# Patient Record
Sex: Male | Born: 1955 | Race: Black or African American | Hispanic: No | Smoking: Never smoker
Health system: Southern US, Community
[De-identification: ages and names within clinical notes are randomized; demographics above are authoritative.]

## PROBLEM LIST (undated history)

## (undated) DIAGNOSIS — N189 Chronic kidney disease, unspecified: Secondary | ICD-10-CM

## (undated) DIAGNOSIS — E785 Hyperlipidemia, unspecified: Secondary | ICD-10-CM

## (undated) DIAGNOSIS — Z87898 Personal history of other specified conditions: Secondary | ICD-10-CM

## (undated) DIAGNOSIS — E291 Testicular hypofunction: Secondary | ICD-10-CM

## (undated) DIAGNOSIS — I10 Essential (primary) hypertension: Secondary | ICD-10-CM

## (undated) DIAGNOSIS — N400 Enlarged prostate without lower urinary tract symptoms: Secondary | ICD-10-CM

## (undated) HISTORY — DX: Essential (primary) hypertension: I10

## (undated) HISTORY — DX: Chronic kidney disease, unspecified: N18.9

## (undated) HISTORY — PX: NASAL SINUS SURGERY: SHX719

## (undated) HISTORY — PX: MOUTH SURGERY: SHX715

## (undated) HISTORY — DX: Personal history of other specified conditions: Z87.898

## (undated) HISTORY — DX: Testicular hypofunction: E29.1

## (undated) HISTORY — DX: Hyperlipidemia, unspecified: E78.5

---

## 2005-04-21 ENCOUNTER — Ambulatory Visit: Payer: Self-pay | Admitting: Internal Medicine

## 2005-04-25 ENCOUNTER — Ambulatory Visit: Payer: Self-pay | Admitting: Internal Medicine

## 2005-04-29 ENCOUNTER — Ambulatory Visit: Payer: Self-pay

## 2006-05-24 ENCOUNTER — Ambulatory Visit: Payer: Self-pay | Admitting: Internal Medicine

## 2006-05-24 LAB — CONVERTED CEMR LAB
ALT: 35 units/L (ref 0–40)
AST: 24 units/L (ref 0–37)
Albumin: 4.3 g/dL (ref 3.5–5.2)
Alkaline Phosphatase: 60 units/L (ref 39–117)
BUN: 12 mg/dL (ref 6–23)
Basophils Relative: 0.3 % (ref 0.0–1.0)
Chol/HDL Ratio, serum: 2.9
Cholesterol: 125 mg/dL (ref 0–200)
Creatinine, Ser: 1.2 mg/dL (ref 0.4–1.5)
Eosinophil percent: 1.8 % (ref 0.0–5.0)
GFR calc non Af Amer: 68 mL/min
HCT: 43 % (ref 39.0–52.0)
Hemoglobin: 14 g/dL (ref 13.0–17.0)
Hgb A1c MFr Bld: 13.3 % — ABNORMAL HIGH (ref 4.6–6.0)
Leukocytes, UA: NEGATIVE
Neutro Abs: 2.3 10*3/uL (ref 1.4–7.7)
Neutrophils Relative %: 43.7 % (ref 43.0–77.0)
Nitrite: NEGATIVE
PSA: 1.71 ng/mL (ref 0.10–4.00)
Potassium: 4.6 meq/L (ref 3.5–5.1)
RBC: 4.83 M/uL (ref 4.22–5.81)
RDW: 12.3 % (ref 11.5–14.6)
Sodium: 141 meq/L (ref 135–145)
Specific Gravity, Urine: >=1.03 (ref 1.000–1.03)
Total Bilirubin: 0.8 mg/dL (ref 0.3–1.2)
Total Protein: 6.8 g/dL (ref 6.0–8.3)
Urine Glucose: >=1000 mg/dL — CR
WBC: 5.2 10*3/uL (ref 4.5–10.5)

## 2006-05-29 ENCOUNTER — Ambulatory Visit: Payer: Self-pay | Admitting: Internal Medicine

## 2007-05-18 ENCOUNTER — Encounter: Payer: Self-pay | Admitting: Internal Medicine

## 2007-06-06 ENCOUNTER — Ambulatory Visit: Payer: Self-pay | Admitting: Internal Medicine

## 2007-06-08 ENCOUNTER — Encounter: Payer: Self-pay | Admitting: Internal Medicine

## 2007-06-08 DIAGNOSIS — I10 Essential (primary) hypertension: Secondary | ICD-10-CM

## 2007-06-08 DIAGNOSIS — E1129 Type 2 diabetes mellitus with other diabetic kidney complication: Secondary | ICD-10-CM

## 2007-06-08 DIAGNOSIS — E785 Hyperlipidemia, unspecified: Secondary | ICD-10-CM

## 2007-06-08 HISTORY — DX: Type 2 diabetes mellitus with other diabetic kidney complication: E11.29

## 2007-06-08 HISTORY — DX: Essential (primary) hypertension: I10

## 2007-06-08 HISTORY — DX: Hyperlipidemia, unspecified: E78.5

## 2007-06-29 ENCOUNTER — Ambulatory Visit: Payer: Self-pay | Admitting: Internal Medicine

## 2007-06-29 DIAGNOSIS — E291 Testicular hypofunction: Secondary | ICD-10-CM

## 2007-06-29 HISTORY — DX: Testicular hypofunction: E29.1

## 2007-09-14 ENCOUNTER — Ambulatory Visit: Payer: Self-pay | Admitting: Internal Medicine

## 2007-09-14 LAB — CONVERTED CEMR LAB
ALT: 24 units/L (ref 0–53)
AST: 21 units/L (ref 0–37)
Albumin: 4.1 g/dL (ref 3.5–5.2)
Calcium: 9.5 mg/dL (ref 8.4–10.5)
Chloride: 102 meq/L (ref 96–112)
Cholesterol: 134 mg/dL (ref 0–200)
Creatinine, Ser: 1.1 mg/dL (ref 0.4–1.5)
GFR calc non Af Amer: 75 mL/min
Glucose, Bld: 203 mg/dL — ABNORMAL HIGH (ref 70–99)
HDL: 39.3 mg/dL (ref >39.0)
LDL Cholesterol: 77 mg/dL (ref 0–99)
Sodium: 138 meq/L (ref 135–145)
Total Bilirubin: 0.6 mg/dL (ref 0.3–1.2)

## 2008-01-29 ENCOUNTER — Encounter: Payer: Self-pay | Admitting: Internal Medicine

## 2008-05-06 ENCOUNTER — Encounter: Payer: Self-pay | Admitting: Internal Medicine

## 2008-07-03 ENCOUNTER — Ambulatory Visit: Payer: Self-pay | Admitting: Internal Medicine

## 2008-07-03 LAB — CONVERTED CEMR LAB
ALT: 30 units/L (ref 0–53)
Albumin: 4.5 g/dL (ref 3.5–5.2)
BUN: 16 mg/dL (ref 6–23)
Basophils Absolute: 0 10*3/uL (ref 0.0–0.1)
Basophils Relative: 0.2 % (ref 0.0–3.0)
CO2: 31 meq/L (ref 19–32)
Calcium: 9.5 mg/dL (ref 8.4–10.5)
Creatinine, Ser: 1 mg/dL (ref 0.4–1.5)
Eosinophils Absolute: 0.1 10*3/uL (ref 0.0–0.7)
Eosinophils Relative: 1.7 % (ref 0.0–5.0)
GFR calc non Af Amer: 83 mL/min
HCT: 40.9 % (ref 39.0–52.0)
HDL: 38.8 mg/dL — ABNORMAL LOW (ref >39.0)
Hemoglobin: 14 g/dL (ref 13.0–17.0)
Ketones, ur: NEGATIVE mg/dL
LDL Cholesterol: 79 mg/dL (ref 0–99)
MCHC: 34.3 g/dL (ref 30.0–36.0)
MCV: 89.2 fL (ref 78.0–100.0)
Neutro Abs: 2.2 10*3/uL (ref 1.4–7.7)
PSA: 2.66 ng/mL (ref 0.10–4.00)
RBC: 4.58 M/uL (ref 4.22–5.81)
Specific Gravity, Urine: >=1.03 (ref 1.000–1.03)
TSH: 3.66 microintl units/mL (ref 0.35–5.50)
Total Bilirubin: 0.9 mg/dL (ref 0.3–1.2)
Total Protein, Urine: 30 mg/dL — AB
Urine Glucose: NEGATIVE mg/dL
VLDL: 13 mg/dL (ref 0–40)
pH: 5.5 (ref 5.0–8.0)

## 2008-07-10 ENCOUNTER — Ambulatory Visit: Payer: Self-pay | Admitting: Internal Medicine

## 2008-07-11 ENCOUNTER — Telehealth: Payer: Self-pay | Admitting: Internal Medicine

## 2008-07-11 ENCOUNTER — Ambulatory Visit: Payer: Self-pay | Admitting: Internal Medicine

## 2008-07-13 ENCOUNTER — Telehealth: Payer: Self-pay | Admitting: Internal Medicine

## 2008-08-27 ENCOUNTER — Telehealth: Payer: Self-pay | Admitting: Internal Medicine

## 2008-09-16 ENCOUNTER — Ambulatory Visit: Payer: Self-pay | Admitting: Internal Medicine

## 2008-11-04 ENCOUNTER — Encounter: Payer: Self-pay | Admitting: Internal Medicine

## 2009-01-26 ENCOUNTER — Telehealth: Payer: Self-pay | Admitting: Internal Medicine

## 2009-02-02 ENCOUNTER — Telehealth: Payer: Self-pay | Admitting: Internal Medicine

## 2009-02-23 ENCOUNTER — Telehealth: Payer: Self-pay | Admitting: Internal Medicine

## 2009-06-23 ENCOUNTER — Telehealth: Payer: Self-pay | Admitting: Internal Medicine

## 2009-07-07 ENCOUNTER — Ambulatory Visit: Payer: Self-pay | Admitting: Internal Medicine

## 2009-07-07 LAB — CONVERTED CEMR LAB
ALT: 22 units/L (ref 0–53)
AST: 19 units/L (ref 0–37)
Alkaline Phosphatase: 66 units/L (ref 39–117)
BUN: 16 mg/dL (ref 6–23)
Basophils Absolute: 0 10*3/uL (ref 0.0–0.1)
Bilirubin Urine: NEGATIVE
Bilirubin, Direct: 0.2 mg/dL (ref 0.0–0.3)
Calcium: 8.9 mg/dL (ref 8.4–10.5)
Eosinophils Relative: 1.6 % (ref 0.0–5.0)
GFR calc non Af Amer: 99.98 mL/min (ref >60)
Glucose, Bld: 174 mg/dL — ABNORMAL HIGH (ref 70–99)
HCT: 41.1 % (ref 39.0–52.0)
HDL: 39.1 mg/dL (ref >39.00)
Hemoglobin, Urine: NEGATIVE
Ketones, ur: NEGATIVE mg/dL
LDL Cholesterol: 63 mg/dL (ref 0–99)
Leukocytes, UA: NEGATIVE
Lymphocytes Relative: 41.4 % (ref 12.0–46.0)
Monocytes Relative: 9.7 % (ref 3.0–12.0)
Neutrophils Relative %: 46.5 % (ref 43.0–77.0)
Nitrite: NEGATIVE
PSA: 5.23 ng/mL — ABNORMAL HIGH (ref 0.10–4.00)
Platelets: 153 10*3/uL (ref 150.0–400.0)
Potassium: 4 meq/L (ref 3.5–5.1)
RDW: 12.6 % (ref 11.5–14.6)
TSH: 1.3 microintl units/mL (ref 0.35–5.50)
Total Bilirubin: 1.1 mg/dL (ref 0.3–1.2)
VLDL: 11.8 mg/dL (ref 0.0–40.0)
WBC: 4.6 10*3/uL (ref 4.5–10.5)
pH: 5 (ref 5.0–8.0)

## 2009-07-16 ENCOUNTER — Ambulatory Visit: Payer: Self-pay | Admitting: Internal Medicine

## 2009-07-16 DIAGNOSIS — R972 Elevated prostate specific antigen [PSA]: Secondary | ICD-10-CM

## 2009-07-16 HISTORY — DX: Elevated prostate specific antigen (PSA): R97.20

## 2009-07-31 ENCOUNTER — Encounter: Payer: Self-pay | Admitting: Internal Medicine

## 2009-09-04 ENCOUNTER — Encounter: Payer: Self-pay | Admitting: Internal Medicine

## 2010-02-25 ENCOUNTER — Encounter: Payer: Self-pay | Admitting: Internal Medicine

## 2010-06-27 HISTORY — PX: MOUTH SURGERY: SHX715

## 2010-07-27 NOTE — Assessment & Plan Note (Signed)
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 exam was normal on 03/16/2009 with advisement of 1 year follow up  Vision Entered By: Ami Bullins CMA (July 16, 2009 10:09 AM)   Primary Care Provider:  Kawanna Christley  CC:  CPX/ pt has never had a colonoscopy/ ab.  History of Present Illness: Patinet presents for follow-up of active medical problems of hypogonadism, diabetes and hypertension. He has been feeeling well. He does report pain in his right heel with initial weight bearing.  No injuries or surgeries in the interval. Last visit to Dr. Retta Diones was in may '10.   Current Medications (verified): 1)  Lisinopril 10 Mg Tabs (Lisinopril) .... Take 1 Tablet By Mouth Once A Day 2)  Zocor 20 Mg  Tabs (Simvastatin) .... Take 1 Tablet By Mouth Once A Day 3)  Amitriptyline Hcl 25 Mg  Tabs (Amitriptyline Hcl) .... Take 1 Tablet By Mouth Once A Day 4)  Metformin Hcl 1000 Mg Tabs (Metformin Hcl) .Marland Kitchen.. 1 By Mouth Two Times A Day. 5)  Androgel Pump 1 %  Gel (Testosterone) .... 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 mother - '32; arthritis brothers- two deceased-alcoholism; cancer; one brother HTN sister-good health Neg- prostate, colon cancer; DM, CAD  Social History: Last updated: 07/10/2008 [employer], [employer] -2 years Married '83-seperated '06 1 son '81; 1 daughter ''5; 1 grandchild (girl) '06 work: load Designer, television/film set at [employer], work is slowing down.  Risk Factors: Caffeine Use: 2 (07/08/2007) Exercise: no (07/10/2008)  Risk Factors: Smoking Status: quit (06/08/2007)  Review of Systems  The patient denies anorexia, fever, weight loss, weight gain, vision loss, decreased hearing, hoarseness, chest pain, syncope, dyspnea on exertion, peripheral edema, prolonged cough, headaches, hemoptysis, abdominal pain, melena, hematochezia, severe indigestion/heartburn, hematuria, incontinence, genital sores, muscle weakness, suspicious skin lesions, difficulty walking, depression, unusual weight change, abnormal bleeding, enlarged lymph nodes, and testicular masses.    Physical Exam  General:  Well-developed,well-nourished,in no acute distress; alert,appropriate and cooperative throughout examination Head:  Normocephalic and atraumatic without obvious abnormalities. No apparent alopecia or balding. Eyes:  vision grossly intact, pupils equal, pupils round, corneas and lenses clear, and no injection.   Ears:  External ear exam shows no significant lesions or deformities.  Otoscopic examination reveals clear canals, tympanic membranes are intact bilaterally without bulging, retraction, inflammation or discharge. Hearing is grossly normal bilaterally. Nose:  no external deformity and no external erythema.  Mouth:  torus palitini, no oral lesions, teeth in good repair, throat clear Neck:  supple, full ROM, no thyromegaly, and no carotid bruits.   Chest Wall:  no deformities.   Lungs:  Normal respiratory effort, chest expands symmetrically. Lungs are clear to auscultation, no crackles or  wheezes. Heart:  Normal rate and regular rhythm. S1 and S2 normal without gallop, murmur, click, rub or other extra sounds. Abdomen:  soft, non-tender, normal bowel sounds, no masses, no guarding, and no hepatomegaly.   Prostate:  deferred to [facility] Msk:  normal ROM, no joint tenderness, no joint swelling, no joint warmth, no redness over joints, no joint deformities, and no joint instability.  Point tenderness plantar aspect right heel Pulses:  2+ radial and DP pusles Extremities:  No clubbing, cyanosis, edema, or deformity noted with normal full range of motion of all joints.   Neurologic:  alert & oriented X3, cranial nerves II-XII intact, strength normal in all extremities, sensation intact to light touch, sensation intact to pinprick, gait normal, and DTRs symmetrical and normal.   Skin:  turgor normal, color normal, no rashes, no suspicious lesions, and no ulcerations.   Cervical Nodes:  no anterior cervical adenopathy and no posterior cervical adenopathy.   Psych:  Oriented X3, memory intact for recent and remote, normally interactive, good eye contact, and not anxious appearing.    Diabetes Management Exam:    Foot Exam (with socks and/or shoes not present):       Sensory-Pinprick/Light touch:          Right medial foot (L-4): normal          Right dorsal foot (L-5): normal          Right lateral foot (S-1): normal       Sensory-other: normal deep vibratory sensation    Eye Exam:       Eye Exam done elsewhere          Date: 10/08/2008          Results: normal          Done by: Dr. 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 Retta Diones. Concern about continued hormone use in a setting of rising PSA.  Problem # 2:  HYPERTENSION (ICD-401.9)  His updated medication list for this problem includes:    Lisinopril 20 Mg Tabs (Lisinopril) .Marland Kitchen... 1 by mouth once daily  BP today: 142/96 Prior BP: 150/90 (09/16/2008)  Labs  Reviewed: K+: 4.0 (07/07/2009) Creat: : 1.0 (07/07/2009)    Suboptimal control - goal is 120/80  Plan - increase Lisinopril to 20mg  daily. Close BP follow-up.  Problem # 3:  HYPERLIPIDEMIA (ICD-272.4)  His updated medication list for this problem includes:    Zocor 20 Mg Tabs (Simvastatin) .Marland Kitchen... Take 1 tablet by mouth once a day  Labs Reviewed: SGOT: 19 (07/07/2009)   SGPT: 22 (07/07/2009)   HDL:39.10 (07/07/2009), 38.8 (07/03/2008)  LDL:63 (07/07/2009), 79 (07/03/2008)  Chol:114 (07/07/2009), 131 (07/03/2008)  Trig:59.0 (07/07/2009), 67 (07/03/2008)  Good control on present medication. Continue the same  Problem # 4:  DIABETES MELLITUS, TYPE II (ICD-250.00)  His updated medication list for this problem includes:    Lisinopril 20 Mg Tabs (Lisinopril) .Marland Kitchen... 1 by mouth once daily    Metformin Hcl 1000 Mg Tabs (Metformin hcl) .Marland Kitchen... 1 by mouth two times a day.    Actos 30 Mg Tabs (Pioglitazone hcl) .Marland Kitchen... 1 by mouth once daily  Orders:  TLB-A1C / Hgb A1C (Glycohemoglobin) (83036-A1C)  Labs Reviewed: Creat: 1.0 (07/07/2009)     Last Eye Exam: normal (10/08/2008) Reviewed HgBA1c results: 9.3 (07/11/2008)  9.7 (09/14/2007)  Tests: (1) Hemoglobin A1C (A1C)   Hemoglobin A1C       [H]  8.9 %    Plan - add actos 30mg  by mouth once daily   Problem # 5:  ELEVATED PROSTATE SPECIFIC ANTIGEN (ICD-790.93) PSA has gone up significantly from Last year: 2.66 to 5.23 over 12 months.  Plan - patient has an appointment with Dr. Retta Diones for Feb - will defer further work up to him.  Problem # 6:  Preventive Health Care (ICD-V70.0) Normla history free of symptoms. Exam, deferring prostate exam, is normal. Labs notable for elevated PSA. Current with tetnus. Had flu shot last year. Is a cnadidate for colonoscopy for colorectal cancer screening.  In summary - a very nice man who is not controlled re: diabetes and has a rising PSA that will need further evaluation. He needs to return in 4-6 weeks  for follow-up: BP check, new med check.   Complete Medication List: 1)  Lisinopril 20 Mg Tabs (Lisinopril) .Marland Kitchen.. 1 by mouth once daily 2)  Zocor 20 Mg Tabs (Simvastatin) .... Take 1 tablet by mouth once a day 3)  Amitriptyline Hcl 25 Mg Tabs (Amitriptyline hcl) .... Take 1 tablet by mouth once a day 4)  Metformin Hcl 1000 Mg Tabs (Metformin hcl) .Marland Kitchen.. 1 by mouth two times a day. 5)  Androgel Pump 1 % Gel (Testosterone) .... Apply daily 6)  Actos 30 Mg Tabs (Pioglitazone hcl) .Marland Kitchen.. 1 by mouth once daily   Patient: Joe Wise Note: All result statuses are Final unless otherwise noted.  Tests: (1) BMP (METABOL)   Sodium                    139 mEq/L                   135-145   Potassium                 4.0 mEq/L                   3.5-5.1   Chloride                  102 mEq/L                   96-112   Carbon Dioxide            30 mEq/L                    19-32   Glucose              [H]  174 mg/dL                   81-19   BUN                       16 mg/dL                    1-47   Creatinine                1.0 mg/dL                   8.2-9.5   Calcium  8.9 mg/dL                   5.7-84.6   GFR                       99.98 mL/min                >60  Tests: (2) CBC Platelet w/Diff (CBCD)   White Cell Count          4.6 K/uL                    4.5-10.5   Red Cell Count            4.49 Mil/uL                 4.22-5.81   Hemoglobin                13.2 g/dL                   96.2-95.2   Hematocrit                41.1 %                      39.0-52.0   MCV                       91.6 fl                     78.0-100.0   MCHC                      32.2 g/dL                   84.1-32.4   RDW                       12.6 %                      11.5-14.6   Platelet Count            153.0 K/uL                  150.0-400.0   Neutrophil %              46.5 %                      43.0-77.0   Lymphocyte %              41.4 %                      12.0-46.0   Monocyte %                 9.7 %                       3.0-12.0   Eosinophils%              1.6 %                       0.0-5.0   Basophils %               0.8 %  0.0-3.0   Neutrophill Absolute      2.2 K/uL                    1.4-7.7   Lymphocyte Absolute       1.9 K/uL                    0.7-4.0   Monocyte Absolute         0.4 K/uL                    0.1-1.0  Eosinophils, Absolute                             0.1 K/uL                    0.0-0.7   Basophils Absolute        0.0 K/uL                    0.0-0.1  Tests: (3) Hepatic/Liver Function Panel (HEPATIC)   Total Bilirubin           1.1 mg/dL                   1.6-1.0   Direct Bilirubin          0.2 mg/dL                   9.6-0.4   Alkaline Phosphatase      66 U/L                      39-117   AST                       19 U/L                      0-37   ALT                       22 U/L                      0-53   Total Protein             6.7 g/dL                    5.4-0.9   Albumin                   4.3 g/dL                    8.1-1.9  Tests: (4) TSH (TSH)   FastTSH                   1.30 uIU/mL                 0.35-5.50  Tests: (5) Lipid Panel (LIPID)   Cholesterol               114 mg/dL                   1-478     ATP III Classification            Desirable:  < 200 mg/dL  Borderline High:  200 - 239 mg/dL               High:  > = 240 mg/dL   Triglycerides             59.0 mg/dL                  2.5-956.3     Normal:  <150 mg/dL     Borderline High:  875 - 199 mg/dL   HDL                       64.33 mg/dL                 >29.51   VLDL Cholesterol          11.8 mg/dL                  8.8-41.6   LDL Cholesterol           63 mg/dL                    6-06  CHO/HDL Ratio:  CHD Risk                             3                    Men          Women     1/2 Average Risk     3.4          3.3     Average Risk          5.0          4.4     2X Average Risk          9.6          7.1     3X Average Risk          15.0           11.0                           Tests: (6) Prostate Specific Antigen (PSA)   PSA-Hyb              [H]  5.23 ng/mL                  0.10-4.00  Tests: (7) UDip Only (UDIP)   Color                     YELLOW       RANGE:  Yellow;Lt. Yellow   Clarity                   CLEAR                       Clear   Specific Gravity          >=1.030                     1.000 - 1.030   Urine Ph                  5.0  5.0-8.0   Protein                   TRACE                       Negative   Urine Glucose             100                         Negative   Ketones                   NEGATIVE                    Negative   Urine Bilirubin           NEGATIVE                    Negative   Blood                     NEGATIVE                    Negative   Urobilinogen              0.2                         0.0 - 1.0   Leukocyte Esterace        NEGATIVE                    Negative   Nitrite                   NEGATIVE                    NegativePrescriptions: ACTOS 30 MG TABS (PIOGLITAZONE HCL) 1 by mouth once daily  #30 x 12   Entered and Authorized by:   Jacques Navy MD   Signed by:   Jacques Navy MD on 07/17/2009   Method used:   Electronically to        [facility] [phone]* (retail)       [address]       [place], [place]  [address]       Ph: [phone] or [phone]       Fax: [phone]   RxID:   [id number] METFORMIN HCL 1000 MG TABS (METFORMIN HCL) 1 by mouth two times a day.  #60 x 12   Entered and Authorized by:   Jacques Navy MD   Signed by:   Jacques Navy MD on 07/16/2009   Method used:   Electronically to        [facility] [phone]* (retail)       [address]       [place], [place]  [address]       Ph: [phone] or [phone]       Fax: [phone]   RxID:   [id number] AMITRIPTYLINE HCL 25 MG  TABS (AMITRIPTYLINE HCL) Take 1 tablet by mouth once a day  #30 Tablet x 12   Entered and Authorized by:   Jacques Navy MD   Signed by:    Jacques Navy MD on 07/16/2009   Method used:   [facility]        [facility] [id number]       [address]       [place], [place]  [address]       Ph: [phone] or [phone]       Fax: [phone]   RxID:   [id number] ZOCOR 20 MG  TABS (SIMVASTATIN) Take 1 tablet by mouth once a day  #30 Tablet x 12   Entered and Authorized by:   Jacques Navy MD   Signed by:   Jacques Navy MD on 07/16/2009   Method used:   Electronically to        [facility] [phone]* (retail)       [address]       [place], [place]  [address]       Ph: [phone] or [phone]       Fax: [phone]   RxID:   [id number] LISINOPRIL 20 MG TABS (LISINOPRIL) 1 by mouth once daily  #30 x 12   Entered and Authorized by:   Jacques Navy MD   Signed by:   Jacques Navy MD on 07/16/2009   Method used:   Electronically to        [facility] [phone][facility] (retail)       [address]       [place], [place]  [address]       Ph: [phone] or [phone]       Fax: [phone]   RxID:   [id number]    Immunization History:  Influenza Immunization History:    Influenza:  historical (03/31/2009)

## 2010-07-27 NOTE — Letter (Signed)
Summary: [facility]   Imported By: Lennie Odor 08/05/2009 11:19:29  _____________________________________________________________________  External Attachment:    Type:   Image     Comment:   External Document

## 2010-07-27 NOTE — Letter (Signed)
Summary: [facility]  [facility]   Imported By: Lester Eyota 03/04/2010 09:25:50  _____________________________________________________________________  External Attachment:    Type:   Image     Comment:   External Document

## 2010-07-30 NOTE — Op Note (Signed)
Summary: [facility]   Imported By: Sherian Rein 09/14/2009 10:27:50  _____________________________________________________________________  External Attachment:    Type:   Image     Comment:   External Document

## 2010-08-11 LAB — HM DIABETES EYE EXAM: HM Diabetic Eye Exam: NORMAL

## 2010-09-03 ENCOUNTER — Other Ambulatory Visit: Payer: Self-pay

## 2010-09-03 ENCOUNTER — Other Ambulatory Visit: Payer: Self-pay | Admitting: Internal Medicine

## 2010-09-03 ENCOUNTER — Encounter (INDEPENDENT_AMBULATORY_CARE_PROVIDER_SITE_OTHER): Payer: Self-pay | Admitting: *Deleted

## 2010-09-03 DIAGNOSIS — Z0000 Encounter for general adult medical examination without abnormal findings: Secondary | ICD-10-CM

## 2010-09-03 LAB — URINALYSIS
Bilirubin Urine: NEGATIVE
Hgb urine dipstick: NEGATIVE
Ketones, ur: NEGATIVE
Leukocytes, UA: NEGATIVE
Urobilinogen, UA: 0.2 (ref 0.0–1.0)

## 2010-09-03 LAB — CBC WITH DIFFERENTIAL/PLATELET
Eosinophils Relative: 1.8 % (ref 0.0–5.0)
HCT: 37.5 % — ABNORMAL LOW (ref 39.0–52.0)
Hemoglobin: 12.7 g/dL — ABNORMAL LOW (ref 13.0–17.0)
Lymphs Abs: 2.1 10*3/uL (ref 0.7–4.0)
MCV: 87.8 fl (ref 78.0–100.0)
Monocytes Absolute: 0.4 10*3/uL (ref 0.1–1.0)
Monocytes Relative: 8.4 % (ref 3.0–12.0)
Neutro Abs: 2.5 10*3/uL (ref 1.4–7.7)
RDW: 14 % (ref 11.5–14.6)
WBC: 5.2 10*3/uL (ref 4.5–10.5)

## 2010-09-03 LAB — HEPATIC FUNCTION PANEL
ALT: 19 U/L (ref 0–53)
AST: 18 U/L (ref 0–37)
Albumin: 4.3 g/dL (ref 3.5–5.2)

## 2010-09-03 LAB — LIPID PANEL
Cholesterol: 121 mg/dL (ref 0–200)
VLDL: 21.2 mg/dL (ref 0.0–40.0)

## 2010-09-03 LAB — BASIC METABOLIC PANEL
BUN: 16 mg/dL (ref 6–23)
Chloride: 104 mEq/L (ref 96–112)
GFR: 79.89 mL/min (ref >60.00)
Glucose, Bld: 137 mg/dL — ABNORMAL HIGH (ref 70–99)
Potassium: 4.6 mEq/L (ref 3.5–5.1)
Sodium: 142 mEq/L (ref 135–145)

## 2010-09-03 LAB — TSH: TSH: 1.41 u[IU]/mL (ref 0.35–5.50)

## 2010-09-08 ENCOUNTER — Encounter: Payer: Self-pay | Admitting: Internal Medicine

## 2010-09-08 ENCOUNTER — Other Ambulatory Visit: Payer: BC Managed Care – PPO

## 2010-09-08 ENCOUNTER — Encounter (INDEPENDENT_AMBULATORY_CARE_PROVIDER_SITE_OTHER): Payer: BC Managed Care – PPO | Admitting: Internal Medicine

## 2010-09-08 ENCOUNTER — Other Ambulatory Visit: Payer: Self-pay | Admitting: Internal Medicine

## 2010-09-08 DIAGNOSIS — E119 Type 2 diabetes mellitus without complications: Secondary | ICD-10-CM

## 2010-09-08 DIAGNOSIS — Z136 Encounter for screening for cardiovascular disorders: Secondary | ICD-10-CM

## 2010-09-08 DIAGNOSIS — Z0000 Encounter for general adult medical examination without abnormal findings: Secondary | ICD-10-CM

## 2010-09-14 NOTE — Assessment & Plan Note (Signed)
Summary: CPX/# /CD   Vital Signs:  Patient profile:   55 year old male Height:      72 inches Weight:      183 pounds BMI:     24.91 O2 Sat:      98 % on Room air Temp:     98.3 degrees F oral Pulse rate:   79 / minute BP sitting:   110 / 78  (left arm) Cuff size:   regular  Vitals Entered By: Bill Salinas CMA (September 08, 2010 10:01 AM)  O2 Flow:  Room air CC: cpx/ ab  Vision Screening:      Vision Comments: Last eye exam Jan 2012 with normal result   Primary Care Provider:  Jimia Gentles  CC:  cpx/ ab.  History of Present Illness: Mr. Alldredge pesents for routine medical folow-up. In the interval since is last visit he has had some major dental work with extraction of several teeth and removal of a bony mass from the roof of his mouth that was benign.  He had an episode of severe dysequilibrium which  by description was transient labyrinthitis. He also has noticed positional vertigo which will last 10-15 seconds with position change. Temporally these two events were soon after his oral surgery.  Otherwise he has been doing well with no medical ilnesses or injury. He is 100% indepnendent in all ADLs., he is cognitively intact and has no evidence of depression.  Current Medications (verified): 1)  Lisinopril 20 Mg Tabs (Lisinopril) .Marland Kitchen.. 1 By Mouth Once Daily 2)  Zocor 20 Mg  Tabs (Simvastatin) .... Take 1 Tablet By Mouth Once A Day 3)  Amitriptyline Hcl 25 Mg  Tabs (Amitriptyline Hcl) .... Take 1 Tablet By Mouth Once A Day 4)  Metformin Hcl 1000 Mg Tabs (Metformin Hcl) .Marland Kitchen.. 1 By Mouth Two Times A Day. 5)  Androgel Pump 1 %  Gel (Testosterone) .... Apply Daily 6)  Actos 30 Mg Tabs (Pioglitazone Hcl) .Marland Kitchen.. 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 father- died 33; history not known. mother - '32; arthritis brothers- two deceased-alcoholism; cancer; one brother HTN sister-good health Neg- prostate, colon cancer; DM, CAD  Social History: Reviewed history from 07/10/2008 and no changes required. HSG, [employer] -2 years Married '83-seperated '06 1 son '81; 1 daughter ''6; 1 grandchild (girl) '06 work: load Designer, television/film set at [employer], work is slowing down.  Review of Systems  The patient denies anorexia, fever, weight loss, weight gain, vision loss, decreased hearing, chest pain, syncope, dyspnea on exertion, prolonged cough, abdominal pain, severe indigestion/heartburn, hematuria, muscle weakness, suspicious skin lesions, difficulty walking, depression, abnormal bleeding, and enlarged lymph nodes.    Physical Exam  General:  alert, well-developed, well-nourished, well-hydrated, and normal appearing AA male.   Head:  normocephalic, atraumatic, and no abnormalities observed.   Eyes:  vision grossly intact, pupils equal, pupils round, and pupils reactive to light.   Ears:  External ear exam shows no significant lesions or deformities.  Otoscopic examination reveals clear canals, tympanic membranes are intact bilaterally without bulging, retraction, inflammation or discharge. Hearing is grossly normal  bilaterally. Nose:  no external deformity and no external erythema.   Mouth:  Missing front teeth, well healed roof of the mouth. Posterior pharynx clear Neck:  supple and full ROM.   Chest Wall:  no deformities.   Lungs:  Normal respiratory effort, chest expands symmetrically. Lungs are clear to auscultation, no crackles or wheezes. Heart:  Normal rate and regular rhythm. S1 and S2 normal without gallop,  murmur, click, rub or other extra sounds. Abdomen:  soft, non-tender, normal bowel sounds, no guarding, no rigidity, and no hepatomegaly.   Prostate:  deferred to GU Msk:  normal ROM, no joint tenderness, no joint swelling, no joint warmth, and no joint deformities.   Pulses:  2+ radial and DP pulses Extremities:  No clubbing, cyanosis, edema, or deformity noted with normal full range of motion of all joints.   Neurologic:  alert & oriented X3, cranial nerves II-XII intact, strength normal in all extremities, and gait normal.   Skin:  turgor normal, color normal, no rashes, and no suspicious lesions.   Cervical Nodes:  no anterior cervical adenopathy and no posterior cervical adenopathy.   Psych:  Oriented X3, memory intact for recent and remote, normally interactive, and good eye contact.    Diabetes Management Exam:    Foot Exam (with socks and/or shoes not present):       Sensory-Pinprick/Light touch:          Right medial foot (L-4): normal          Right dorsal foot (L-5): normal          Right lateral foot (S-1): normal       Sensory-Monofilament:          Right foot: normal       Inspection:          Right foot: normal       Nails:          Right foot: normal    Eye Exam:       Eye Exam done elsewhere          Date: 08/11/2010          Results: normal          Done by: optomitrist   Impression & Recommendations:  Problem # 1:  ELEVATED PROSTATE SPECIFIC ANTIGEN (ICD-790.93) PSA     1/10       1/11     3 /12              2.6          5.23     6.12  He has seen Dr. Retta Diones and had negative prostate u/s and biopsy  Plan - keep up coming appointment with Dr. 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 1 by mouth once daily  BP today: 110/78 Prior BP:  142/96 (07/16/2009)  Labs Reviewed: K+: 4.6 (09/03/2010) Creat: : 1.2 (09/03/2010)    Good control on present medical regimen - no changes proposed  Problem # 4:  HYPERLIPIDEMIA (ICD-272.4)  His updated medication list for this problem includes:    Zocor 20 Mg Tabs (Simvastatin) .Marland Kitchen... Take 1 tablet by mouth once a day  Labs Reviewed: SGOT: 18 (09/03/2010)   SGPT: 19 (09/03/2010)   HDL:42.00 (09/03/2010), 39.10 (07/07/2009)  LDL:58 (09/03/2010), 63 (  07/07/2009)  Chol:121 (09/03/2010), 114 (07/07/2009)  Trig:106.0 (09/03/2010), 59.0 (07/07/2009)   Excellent control on present medication which he tolerates well. Will continue present regimen  Problem # 5:  DIABETES MELLITUS, TYPE II (ICD-250.00) Due for A1C with recommendations to follow  His updated medication list for this problem includes:    Lisinopril 20 Mg Tabs (Lisinopril) .Marland Kitchen... 1 by mouth once daily    Metformin Hcl 1000 Mg Tabs (Metformin hcl) .Marland Kitchen... 1 by mouth two times a day.    Actos 30 Mg Tabs (Pioglitazone hcl) .Marland Kitchen... 1 by mouth once daily  Orders: TLB-A1C / Hgb A1C (Glycohemoglobin) (83036-A1C)  Addednum - A1C just above goal at 7.5% . No change in medication but recommend better dietary management. Repeat A1C in 6 months.  Problem # 6:  ROUTINE GENERAL MEDICAL EXAM@HEALTH  CARE FACL (ICD-V70.0) Interval history with dental work as noted. 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 Labs are normal as noted. Prostate cancer screening for elevated PSA has been done and he will continue to follow with Dr. Retta Diones. He is a candidate for colonoscopy for routine colorectal cancer screening. Immunizations: Tetnus Jan '09. 12 lead EKG is without evidence of ischemia or injury.  In summary- a very nice man who appears to be medically stable. He will return for A1C in 6 months otherwise he will return in 1 year or as needed.   Complete  Medication List: 1)  Lisinopril 20 Mg Tabs (Lisinopril) .Marland Kitchen.. 1 by mouth once daily 2)  Zocor 20 Mg Tabs (Simvastatin) .... Take 1 tablet by mouth once a day 3)  Amitriptyline Hcl 25 Mg Tabs (Amitriptyline hcl) .... Take 1 tablet by mouth once a day 4)  Metformin Hcl 1000 Mg Tabs (Metformin hcl) .Marland Kitchen.. 1 by mouth two times a day. 5)  Androgel Pump 1 % Gel (Testosterone) .... Apply daily 6)  Actos 30 Mg Tabs (Pioglitazone hcl) .Marland Kitchen.. 1 by mouth once daily  Other Orders: EKG w/ Interpretation (93000)  Patient: Joe Wise Note: All result statuses are Final unless otherwise noted.  Tests: (1) Lipid Panel (LIPID)   Cholesterol               121 mg/dL                   8-295     ATP III Classification            Desirable:  < 200 mg/dL                    Borderline High:  200 - 239 mg/dL               High:  > = 240 mg/dL   Triglycerides             106.0 mg/dL                 6.2-130.8     Normal:  <150 mg/dL     Borderline High:  657 - 199 mg/dL   HDL                       84.69 mg/dL                 >62.95   VLDL Cholesterol          21.2 mg/dL  0.0-40.0   LDL Cholesterol           58 mg/dL                    1-61  CHO/HDL Ratio:  CHD Risk                             3                    Men          Women     1/2 Average Risk     3.4          3.3     Average Risk          5.0          4.4     2X Average Risk          9.6          7.1     3X Average Risk          15.0          11.0                           Tests: (2) BMP (METABOL)   Sodium                    142 mEq/L                   135-145   Potassium                 4.6 mEq/L                   3.5-5.1   Chloride                  104 mEq/L                   96-112   Carbon Dioxide            32 mEq/L                    19-32   Glucose              [H]  137 mg/dL                   09-60   BUN                       16 mg/dL                    4-54   Creatinine                1.2 mg/dL                    0.9-8.1   Calcium                   9.5 mg/dL                   1.9-14.7   GFR                       79.89 mL/min                >  60.00  Tests: (3) CBC Platelet w/Diff (CBCD)   White Cell Count          5.2 K/uL                    4.5-10.5   Red Cell Count            4.28 Mil/uL                 4.22-5.81   Hemoglobin           [L]  12.7 g/dL                   19.1-47.8   Hematocrit           [L]  37.5 %                      39.0-52.0   MCV                       87.8 fl                     78.0-100.0   MCHC                      33.8 g/dL                   29.5-62.1   RDW                       14.0 %                      11.5-14.6   Platelet Count            189.0 K/uL                  150.0-400.0   Neutrophil %              48.7 %                      43.0-77.0   Lymphocyte %              40.4 %                      12.0-46.0   Monocyte %                8.4 %                       3.0-12.0   Eosinophils%              1.8 %                       0.0-5.0   Basophils %               0.7 %                       0.0-3.0   Neutrophill Absolute      2.5 K/uL                    1.4-7.7   Lymphocyte Absolute       2.1 K/uL  0.7-4.0   Monocyte Absolute         0.4 K/uL                    0.1-1.0  Eosinophils, Absolute                             0.1 K/uL                    0.0-0.7   Basophils Absolute        0.0 K/uL                    0.0-0.1  Tests: (4) Hepatic/Liver Function Panel (HEPATIC)   Total Bilirubin           0.6 mg/dL                   1.6-1.0   Direct Bilirubin          0.1 mg/dL                   9.6-0.4   Alkaline Phosphatase      55 U/L                      39-117   AST                       18 U/L                      0-37   ALT                       19 U/L                      0-53   Total Protein             6.4 g/dL                    5.4-0.9   Albumin                   4.3 g/dL                    8.1-1.9  Tests: (5) TSH (TSH)   FastTSH                    1.41 uIU/mL                 0.35-5.50  Tests: (6) UDip Only (UDIP)   Color                     Yellow       RANGE:  Yellow;Lt. Yellow   Clarity                   Sl Cloudy                   Clear   Specific Gravity          >=1.030                     1.000 - 1.030   Urine Ph                  6.0  5.0-8.0   Protein                   NEGATIVE                    Negative   Urine Glucose             NEGATIVE                    Negative   Ketones                   NEGATIVE                    Negative   Urine Bilirubin           NEGATIVE                    Negative   Blood                     NEGATIVE                    Negative   Urobilinogen              0.2                         0.0 - 1.0   Leukocyte Esterace        NEGATIVE                    Negative   Nitrite                   NEGATIVE                    Negative Tests: (1) Hemoglobin A1C (A1C)   Hemoglobin A1C       [H]  7.5 %                       4.6-6.5     Glycemic Control Guidelines for People with Diabetes:     Non Diabetic:  <6%     Goal of Therapy: <7%     Additional Action Suggested:  >8% Prescriptions: ACTOS 30 MG TABS (PIOGLITAZONE HCL) 1 by mouth once daily  #30 x 12   Entered and Authorized by:   Jacques Navy MD   Signed by:   Jacques Navy MD on 09/08/2010   Method used:   Electronically to        [facility] [phone]* (retail)       [address]       [place], [place]  [address]       Ph: [phone] or [phone]       Fax: [phone]   RxID:   [id number] METFORMIN HCL 1000 MG TABS (METFORMIN HCL) 1 by mouth two times a day.  #60.0 Tablet x 12   Entered and Authorized by:   Jacques Navy MD   Signed by:   Jacques Navy MD on 09/08/2010   Method used:   Electronically to        [facility] [phone]* (retail)       [address]       [place], [place]  [address]       Ph: [phone] or [phone]  Fax: [phone]   RxID:   [id number] AMITRIPTYLINE HCL 25  MG  TABS (AMITRIPTYLINE HCL) Take 1 tablet by mouth once a day  #30.0 Tablet x 12   Entered and Authorized by:   Jacques Navy MD   Signed by:   Jacques Navy MD on 09/08/2010   Method used:   Electronically to        [facility] [phone]* (retail)       [address]       [place], [place]  [address]       Ph: [phone] or [phone]       Fax: [phone]   RxID:   [id number] ZOCOR 20 MG  TABS (SIMVASTATIN) Take 1 tablet by mouth once a day  #30 Tablet x 12   Entered and Authorized by:   Jacques Navy MD   Signed by:   Jacques Navy MD on 09/08/2010   Method used:   Electronically to        [facility] [phone]* (retail)       [address]       [place], [place]  [address]       Ph: [phone] or [phone]       Fax: [phone]   RxID:   [id number] LISINOPRIL 20 MG TABS (LISINOPRIL) 1 by mouth once daily  #30.0 Tablet x 12   Entered and Authorized by:   Jacques Navy MD   Signed by:   Jacques Navy MD on 09/08/2010   Method used:   Electronically to        [facility] [phone]* (retail)       [address]       [place], [place]  [address]       Ph: [phone] or [phone]       Fax: [phone]   RxID:   [id number]    Orders Added: 1)  Est. Patient 40-64 years [99396] 2)  EKG w/ Interpretation [93000] 3)  TLB-A1C / Hgb A1C (Glycohemoglobin) [40814-G8J]

## 2010-11-12 NOTE — Assessment & Plan Note (Signed)
[facility]                           PRIMARY CARE OFFICE NOTE   ASON, HESLIN                     MRN:          [id number]  DATE:05/29/2006                            DOB:          03/30/1956    HISTORY OF PRESENT ILLNESS:  Joe Wise is a very pleasant 55 year old  African American gentleman who presents for follow up evaluation and  exam.  He was last seen in the office April 25, 2005, please see that  dictation.   INTERVAL HISTORY:  The patient has been doing well.  He has been working  hard on controlling his diet as far as his potassium, sugars and  carbohydrates.  He does report he has some polyuria and polydipsia.   PAST SURGICAL HISTORY:  The patient had nasal surgery in 1988.  No other  surgeries.   PAST MEDICAL HISTORY:  1. The patient had usual childhood diseases.  2. Non-insulin-dependent diabetes, diet managed.  3. Hyperlipidemia.  4. Hypertension.   CURRENT MEDICATIONS:  1. Elavil 25 mg q.h.s.  2. Lisinopril 10 mg daily.  3. Zocor 20 mg daily.   SOCIAL HISTORY:  The patient was married 24 years but has been separated  almost two years now.  He has a son who is 12, a daughter who is 67.  The patient continues to have a big garden.   REVIEW OF SYSTEMS:  Negative for Constitutional, Cardiovascular,  Respiratory, GU, GI problems.   PHYSICAL EXAMINATION:  With the help of Belia Heman, FNP student.  VITAL SIGNS:  Temperature 98.9, blood pressure 132/87, pulse 81, weight  188, height 6 feet 1 inches.  GENERAL:  Well-nourished, well-developed [place] American male in no  acute distress.  HEENT:  Normocephalic, atraumatic.  TAC's/TM's unremarkable.  Oropharynx  with __________.  Dentitions in good repair.  No buccal lesions noted.  Posterior pharynx was clear.  Conjunctivae and sclerae were clear.  PERRLA.  EOMI.  Funduscopic exam was unremarkable.  NECK:  Supple without thyromegaly.  No adenopathy was noted in  the  cervical or supraclavicular regions.  CHEST:  No CVA tenderness.  LUNGS:  Clear to auscultation and percussion.  CARDIOVASCULAR:  Radial pulses 2+.  No JVD or carotid bruits.  He had a  quite precordium with regular rate and rhythm without murmurs, rubs or  gallops.  ABDOMEN:  Soft.  No guarding or rebound.  No organosplenomegaly was  appreciated.  GU:  Normal male phallus.  Bilaterally distended testicles without  masses.  RECTAL:  Normal sphincter tone.  Prostate is smooth, round and normal in  size and contour without nodules.  EXTREMITIES:  Without cyanosis, clubbing or edema or deformity.  NEUROLOGICAL:  Intact with no focal abnormalities noted.  SKIN:  Clear.   LABORATORY DATA:  Hemoglobin 14, white count 5200, normal differential.  Cholesterol 125, triglycerides 73, HDL 42.5, LDL 68.  Chemistries were  unremarkable with a glucose of 228, creatinine 1.2, GFR 82 ml per  minute.  Liver functions were normal.  TSH was normal at 1.58, PSA was  normal at 1.71.  Hemoglobin A1C was 13.3%.  Urinalysis  with glucose  greater than 1000.   ASSESSMENT/PLAN:  1. Hypertension.  The patient's blood pressure is adequately      controlled on his present medical regimen.  He will continue the      same.  2. Hyperlipidemia.  Patient with excellent control with an LDL      definitely a goal.  3. Diabetes.  The patient is poorly controlled with a markedly      elevated hemoglobin A1C.   PLAN:  The patient is to start Metformin 500 mg b.i.d. with follow up  A1C in three months.   HEALTH MAINTENANCE:  Patient with normal examination, generally doing  well.  At his age, he would be a good candidate for colorectal cancer  screening and will be referred to GI for the same.   SUMMARY:  A pleasant gentleman who is medically stable except for poorly  controlled diabetes which will be addressed by restoring Metformin as  noted.     Joe Gess Norins, MD  Electronically Signed     MEN/MedQ  DD: 05/29/2006  DT: 05/30/2006  Job #: [id number]   cc:   [place] Pho

## 2011-03-05 ENCOUNTER — Other Ambulatory Visit: Payer: Self-pay | Admitting: Internal Medicine

## 2011-03-05 DIAGNOSIS — IMO0001 Reserved for inherently not codable concepts without codable children: Secondary | ICD-10-CM

## 2011-03-09 ENCOUNTER — Other Ambulatory Visit: Payer: BC Managed Care – PPO

## 2011-09-15 ENCOUNTER — Other Ambulatory Visit: Payer: Self-pay | Admitting: Internal Medicine

## 2011-09-20 ENCOUNTER — Other Ambulatory Visit: Payer: Self-pay | Admitting: Internal Medicine

## 2011-09-21 NOTE — Telephone Encounter (Signed)
Denied--done 03.21.13

## 2011-09-26 ENCOUNTER — Telehealth: Payer: Self-pay

## 2011-09-26 MED ORDER — AMITRIPTYLINE HCL 25 MG PO TABS: 25.0000 mg | ORAL_TABLET | Freq: Every evening | ORAL | Status: DC | PRN

## 2011-09-26 NOTE — Telephone Encounter (Signed)
[facility] Triage Call Report Triage Record Num: [id number] Operator: Lodema Pilot Patient Name: Joe Wise Call Date & Time: 09/24/2011 9:27:38AM Patient Phone: [phone] PCP: Illene Regulus Patient Gender: Male PCP Fax : [phone] Patient DOB: 01/01/1956 Practice Name: [facility] Reason for Call: Caller: [name]/Patient; PCP: Illene Regulus; CB#: [phone]; Call regarding ran out of Amitriptyline. Pt denies any need to triage. Per standing order, authorized Amitriptyline 25 mg PO daily #3. No Refills. Advised to f/u with office on Monday 09/26/11. [facility] [address] at [phone], and spoke with Fayrene Fearing, Rph. Protocol(s) Used: Office Note Recommended Outcome per Protocol: Information Noted and Sent to Office Reason for Outcome: Caller information to office Care Advice: ~ 09/24/2011 9:44:51AM Page 1 of 1 [id number]_[id number]

## 2011-09-26 NOTE — Telephone Encounter (Signed)
Done

## 2011-09-26 NOTE — Telephone Encounter (Signed)
OK to refill elavil prn

## 2011-10-13 ENCOUNTER — Other Ambulatory Visit: Payer: Self-pay | Admitting: Internal Medicine

## 2011-11-16 ENCOUNTER — Encounter: Payer: Self-pay | Admitting: Internal Medicine

## 2011-11-22 ENCOUNTER — Encounter: Payer: Self-pay | Admitting: Internal Medicine

## 2011-11-22 ENCOUNTER — Ambulatory Visit (INDEPENDENT_AMBULATORY_CARE_PROVIDER_SITE_OTHER): Payer: BC Managed Care – PPO | Admitting: Internal Medicine

## 2011-11-22 ENCOUNTER — Other Ambulatory Visit (INDEPENDENT_AMBULATORY_CARE_PROVIDER_SITE_OTHER): Payer: BC Managed Care – PPO

## 2011-11-22 VITALS — BP 140/90 | HR 91 | Temp 98.3°F | Resp 16 | Wt 189.0 lb

## 2011-11-22 DIAGNOSIS — Z0000 Encounter for general adult medical examination without abnormal findings: Secondary | ICD-10-CM | POA: Insufficient documentation

## 2011-11-22 DIAGNOSIS — E785 Hyperlipidemia, unspecified: Secondary | ICD-10-CM

## 2011-11-22 DIAGNOSIS — Z1211 Encounter for screening for malignant neoplasm of colon: Secondary | ICD-10-CM

## 2011-11-22 DIAGNOSIS — IMO0001 Reserved for inherently not codable concepts without codable children: Secondary | ICD-10-CM

## 2011-11-22 DIAGNOSIS — R972 Elevated prostate specific antigen [PSA]: Secondary | ICD-10-CM

## 2011-11-22 DIAGNOSIS — E119 Type 2 diabetes mellitus without complications: Secondary | ICD-10-CM

## 2011-11-22 DIAGNOSIS — I10 Essential (primary) hypertension: Secondary | ICD-10-CM

## 2011-11-22 HISTORY — DX: Encounter for general adult medical examination without abnormal findings: Z00.00

## 2011-11-22 LAB — HEPATIC FUNCTION PANEL
ALT: 19 U/L (ref 0–53)
AST: 23 U/L (ref 0–37)
Albumin: 4.3 g/dL (ref 3.5–5.2)
Total Protein: 7 g/dL (ref 6.0–8.3)

## 2011-11-22 LAB — COMPREHENSIVE METABOLIC PANEL
ALT: 19 U/L (ref 0–53)
AST: 23 U/L (ref 0–37)
Albumin: 4.3 g/dL (ref 3.5–5.2)
Alkaline Phosphatase: 56 U/L (ref 39–117)
Glucose, Bld: 126 mg/dL — ABNORMAL HIGH (ref 70–99)
Potassium: 4.4 mEq/L (ref 3.5–5.1)
Sodium: 141 mEq/L (ref 135–145)
Total Bilirubin: 0.6 mg/dL (ref 0.3–1.2)
Total Protein: 7 g/dL (ref 6.0–8.3)

## 2011-11-22 LAB — HEMOGLOBIN A1C: Hgb A1c MFr Bld: 7.7 % — ABNORMAL HIGH (ref 4.6–6.5)

## 2011-11-22 LAB — LIPID PANEL
HDL: 47.7 mg/dL (ref >39.00)
Total CHOL/HDL Ratio: 2

## 2011-11-22 MED ORDER — SIMVASTATIN 20 MG PO TABS: 20.0000 mg | ORAL_TABLET | Freq: Every day | ORAL | Status: DC

## 2011-11-22 MED ORDER — LISINOPRIL 20 MG PO TABS: 20.0000 mg | ORAL_TABLET | Freq: Every day | ORAL | Status: DC

## 2011-11-22 MED ORDER — METFORMIN HCL 1000 MG PO TABS: 1000.0000 mg | ORAL_TABLET | Freq: Two times a day (BID) | ORAL | Status: DC

## 2011-11-22 MED ORDER — AMITRIPTYLINE HCL 25 MG PO TABS: 25.0000 mg | ORAL_TABLET | Freq: Every evening | ORAL | Status: DC | PRN

## 2011-11-22 MED ORDER — GLIMEPIRIDE 4 MG PO TABS: 4.0000 mg | ORAL_TABLET | Freq: Every day | ORAL | Status: DC

## 2011-11-22 NOTE — Assessment & Plan Note (Signed)
BP Readings from Last 3 Encounters:  11/22/11 140/90  09/08/10 110/78  07/16/09 142/96   Running at the borderline.  Plan Check BP at home or drugstore. If top number consistently above 140 will need to adjust medication.

## 2011-11-22 NOTE — Assessment & Plan Note (Signed)
A1C is suboptimal at greater than 7% but not so high as to require a change in medication.  Plan -  Continue present medication  Close attention to diet: work on low carb diet (breads, pasta, etc)  Repeat A1C in 6 months (order entered)

## 2011-11-22 NOTE — Assessment & Plan Note (Signed)
Stable per urology. Follow up as directed.

## 2011-11-22 NOTE — Assessment & Plan Note (Signed)
Interval history is benign. Physical exam is normal. Lab results are great. He will be referred to screening colonoscopy. He is current with ophthal exam and will request a report be forwarded to the office. Immunizations are current.  In summary - a very nice man who is medically stable but needs to bring his diabetes under better control with improved diet. He will return in 6 months for lab. He will otherwise return in 1 year or sooner as needed.

## 2011-11-22 NOTE — Assessment & Plan Note (Signed)
Lab reveals EXCELLENT control.  Plan Continue present medications

## 2011-11-22 NOTE — Progress Notes (Signed)
Subjective:    Patient ID: Joe Wise, male    DOB: [birth year], 56 y.o.   MRN: [id number]  HPI Mr. Daoud presents today for a wellness exam. He has had a good year: no major illness, no surgery and no injuries. He is totally Independent in ADLs.  He is taking good care of himself. He does follow with urology for elevated PSA - most recent note reviewed and he was doing well: stable PSA, normal prostate exam. He reports that his blood sugars have been well controlled.   Past Medical History  Diagnosis Date  . Hypertension   . Hyperlipidemia   . Diabetes mellitus   . Other testicular hypofunction    Past Surgical History  Procedure Date  . Nasal sinus surgery   . Mouth surgery   . 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 Arthritis Mother   . Alcohol abuse Brother   . Cancer Brother   . Alcohol abuse Brother   . Cancer Brother   . Hypertension Brother   . Diabetes Neg Hx   . Heart disease Neg Hx    History   Social History  . Marital Status: Divorced    Spouse Name: N/A    Number of Children: 2  . Years of Education: 14   Occupational History  . load operator    Social History Main Topics  . Smoking status: Never Smoker   . Smokeless tobacco: Current User   Comment: counselled  . Alcohol Use: 1.0 oz/week    2 drink(s) per week     couple of beers  . Drug Use: No  . Sexually Active: Yes -- Male partner(s)   Other Topics Concern  . Not on file   Social History Narrative   HSG, [employer] -2 years. Married '83-seperated/divorced '06. 1 son '81; 1 daughter ''75; 1 grandchild (girl) '06. 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       Review of Systems Constitutional:  Negative for fever, chills, activity change and unexpected weight change.  HEENT:  Negative for hearing loss, ear pain, congestion, neck stiffness and postnasal drip. Negative for sore throat or  swallowing problems. Negative for dental complaints.   Eyes: Negative for vision loss or change in visual acuity.  Respiratory: Negative for chest tightness and wheezing. Negative for DOE.   Cardiovascular: Negative for chest pain or palpitations. No decreased exercise tolerance Gastrointestinal: No change in bowel habit. No bloating or gas. No reflux or indigestion Genitourinary: Negative for urgency, frequency, flank pain and difficulty urinating.  Musculoskeletal: Negative for myalgias, back pain, arthralgias and gait problem.  Neurological: Negative for dizziness, tremors, weakness and headaches.  Hematological: Negative for adenopathy.  Psychiatric/Behavioral: Negative for behavioral problems and dysphoric mood.       Objective:   Physical Exam Filed Vitals:   11/22/11 0906  BP: 140/90  Pulse: 91  Temp: 98.3 F (36.8 C)  Resp: 16   Wt Readings from Last 3 Encounters:  11/22/11 189 lb (85.73 kg)  09/08/10 183 lb (83.008 kg)  07/16/09 183 lb (83.008 kg)    Gen'l: Well nourished well developed AA male in no acute distress  HEENT: Head: Normocephalic and atraumatic. Right Ear: External ear normal. EAC/TM nl. Left Ear: External ear normal.  EAC/TM nl. Nose: Nose normal. Mouth/Throat: Oropharynx is clear and moist. Dentition - native, in good repair. No buccal  or palatal lesions. Posterior pharynx clear. Eyes: Conjunctivae and sclera clear. EOM intact. Pupils are equal, round, and reactive to light. Right eye exhibits no discharge. Left eye exhibits no discharge. Neck: Normal range of motion. Neck supple. No JVD present. No tracheal deviation present. No thyromegaly present.  Cardiovascular: Normal rate, regular rhythm, no gallop, no friction rub, no murmur heard.      Quiet precordium. 2+ radial and DP pulses . No carotid bruits Pulmonary/Chest: Effort normal. No respiratory distress or increased WOB, no wheezes, no rales. No chest wall deformity or CVAT. Abdominal: Soft. Bowel  sounds are normal in all quadrants. He exhibits no distension, no tenderness, no rebound or guarding, No heptosplenomegaly  Genitourinary:  deferred to Dr. Retta Diones Musculoskeletal: Normal range of motion. He exhibits no edema and no tenderness.       Small and large joints without redness, synovial thickening or deformity. Full range of motion preserved about all small, median and large joints.  Lymphadenopathy:    He has no cervical or supraclavicular adenopathy.  Neurological: He is alert and oriented to person, place, and time. CN II-XII intact. DTRs 2+ and symmetrical biceps, radial and patellar tendons. Cerebellar function normal with no tremor, rigidity, normal gait and station. Normal sensation right foot to light touch, pin-prick and deep vibratory sensation. Skin: Skin is warm and dry. No rash noted. No erythema.  Psychiatric: He has a normal mood and affect. His behavior is normal. Thought content normal.   Lab Results  Component Value Date   WBC 5.2 09/03/2010   HGB 12.7* 09/03/2010   HCT 37.5* 09/03/2010   PLT 189.0 09/03/2010   GLUCOSE 126* 11/22/2011   CHOL 110 11/22/2011   TRIG 71.0 11/22/2011   HDL 47.70 11/22/2011   LDLCALC 48 11/22/2011        ALT 19 11/22/2011   AST 23 11/22/2011        NA 141 11/22/2011   K 4.4 11/22/2011   CL 106 11/22/2011   CREATININE 1.4 11/22/2011   BUN 24* 11/22/2011   CO2 28 11/22/2011   TSH 1.41 09/03/2010   PSA 6.12* 09/03/2010   HGBA1C 7.6* 11/22/2011               Assessment & Plan:

## 2012-02-06 ENCOUNTER — Encounter: Payer: Self-pay | Admitting: Gastroenterology

## 2012-03-20 ENCOUNTER — Other Ambulatory Visit: Payer: Self-pay | Admitting: Internal Medicine

## 2012-04-27 DIAGNOSIS — N189 Chronic kidney disease, unspecified: Secondary | ICD-10-CM | POA: Insufficient documentation

## 2012-04-27 HISTORY — DX: Chronic kidney disease, unspecified: N18.9

## 2012-05-16 ENCOUNTER — Emergency Department (HOSPITAL_BASED_OUTPATIENT_CLINIC_OR_DEPARTMENT_OTHER): Payer: BC Managed Care – PPO

## 2012-05-16 ENCOUNTER — Emergency Department (HOSPITAL_BASED_OUTPATIENT_CLINIC_OR_DEPARTMENT_OTHER)
Admission: EM | Admit: 2012-05-16 | Discharge: 2012-05-16 | Disposition: A | Discharge status: Home / Self Care | Payer: BC Managed Care – PPO | Attending: Emergency Medicine | Admitting: Emergency Medicine

## 2012-05-16 ENCOUNTER — Encounter (HOSPITAL_BASED_OUTPATIENT_CLINIC_OR_DEPARTMENT_OTHER): Payer: Self-pay | Admitting: *Deleted

## 2012-05-16 DIAGNOSIS — E785 Hyperlipidemia, unspecified: Secondary | ICD-10-CM | POA: Insufficient documentation

## 2012-05-16 DIAGNOSIS — Z8639 Personal history of other endocrine, nutritional and metabolic disease: Secondary | ICD-10-CM | POA: Insufficient documentation

## 2012-05-16 DIAGNOSIS — Z79899 Other long term (current) drug therapy: Secondary | ICD-10-CM | POA: Insufficient documentation

## 2012-05-16 DIAGNOSIS — R110 Nausea: Secondary | ICD-10-CM | POA: Insufficient documentation

## 2012-05-16 DIAGNOSIS — I10 Essential (primary) hypertension: Secondary | ICD-10-CM | POA: Insufficient documentation

## 2012-05-16 DIAGNOSIS — N200 Calculus of kidney: Secondary | ICD-10-CM

## 2012-05-16 DIAGNOSIS — E278 Other specified disorders of adrenal gland: Secondary | ICD-10-CM | POA: Insufficient documentation

## 2012-05-16 DIAGNOSIS — Z862 Personal history of diseases of the blood and blood-forming organs and certain disorders involving the immune mechanism: Secondary | ICD-10-CM | POA: Insufficient documentation

## 2012-05-16 DIAGNOSIS — E119 Type 2 diabetes mellitus without complications: Secondary | ICD-10-CM | POA: Insufficient documentation

## 2012-05-16 LAB — CBC WITH DIFFERENTIAL/PLATELET
Basophils Absolute: 0 10*3/uL (ref 0.0–0.1)
Basophils Relative: 1 % (ref 0–1)
Eosinophils Absolute: 0.1 10*3/uL (ref 0.0–0.7)
Hemoglobin: 13.1 g/dL (ref 13.0–17.0)
MCH: 29 pg (ref 26.0–34.0)
MCHC: 34.2 g/dL (ref 30.0–36.0)
Monocytes Relative: 10 % (ref 3–12)
Neutro Abs: 4 10*3/uL (ref 1.7–7.7)
Neutrophils Relative %: 56 % (ref 43–77)
RDW: 13.6 % (ref 11.5–15.5)

## 2012-05-16 LAB — URINALYSIS, ROUTINE W REFLEX MICROSCOPIC
Bilirubin Urine: NEGATIVE
Ketones, ur: NEGATIVE mg/dL
Leukocytes, UA: NEGATIVE
Nitrite: NEGATIVE
Protein, ur: NEGATIVE mg/dL
Urobilinogen, UA: 1 mg/dL (ref 0.0–1.0)
pH: 5.5 (ref 5.0–8.0)

## 2012-05-16 LAB — URINE MICROSCOPIC-ADD ON

## 2012-05-16 LAB — BASIC METABOLIC PANEL
CO2: 29 mEq/L (ref 19–32)
Calcium: 9.8 mg/dL (ref 8.4–10.5)
Creatinine, Ser: 1.5 mg/dL — ABNORMAL HIGH (ref 0.50–1.35)
GFR calc Af Amer: 58 mL/min — ABNORMAL LOW (ref >90)

## 2012-05-16 MED ORDER — ONDANSETRON 8 MG PO TBDP: ORAL_TABLET | ORAL | Status: DC

## 2012-05-16 MED ORDER — TRAMADOL HCL 50 MG PO TABS
50.0000 mg | ORAL_TABLET | Freq: Once | ORAL | Status: AC
  Administered 2012-05-16: 50 mg via ORAL
  Filled 2012-05-16: qty 1

## 2012-05-16 MED ORDER — OXYCODONE-ACETAMINOPHEN 5-325 MG PO TABS: 1.0000 | ORAL_TABLET | Freq: Four times a day (QID) | ORAL | Status: DC | PRN

## 2012-05-16 MED ORDER — TAMSULOSIN HCL 0.4 MG PO CAPS: 0.4000 mg | ORAL_CAPSULE | Freq: Every day | ORAL | Status: DC

## 2012-05-16 MED ORDER — ONDANSETRON HCL 4 MG/2ML IJ SOLN
4.0000 mg | Freq: Once | INTRAMUSCULAR | Status: AC
  Administered 2012-05-16: 4 mg via INTRAVENOUS
  Filled 2012-05-16: qty 2

## 2012-05-16 MED ORDER — SODIUM CHLORIDE 0.9 % IV BOLUS (SEPSIS)
1000.0000 mL | Freq: Once | INTRAVENOUS | Status: AC
  Administered 2012-05-16: 1000 mL via INTRAVENOUS

## 2012-05-16 MED ORDER — KETOROLAC TROMETHAMINE 30 MG/ML IJ SOLN
30.0000 mg | Freq: Once | INTRAMUSCULAR | Status: AC
  Administered 2012-05-16: 30 mg via INTRAVENOUS
  Filled 2012-05-16: qty 1

## 2012-05-16 NOTE — ED Notes (Signed)
Pt sts he awoke this am with left flank pain that lasted apprx. 45 min and then resolved. He sts the pain returned this evening.

## 2012-05-16 NOTE — ED Provider Notes (Signed)
History     CSN: [id number]  Arrival date & time 05/16/12  0109   First MD Initiated Contact with Patient 05/16/12 0124      Chief Complaint  Patient presents with  . Flank Pain    (Consider location/radiation/quality/duration/timing/severity/associated sxs/prior treatment) Patient is a 56 y.o. male presenting with flank pain and abdominal pain. The history is provided by the patient.  Flank Pain This is a new problem. The current episode started 6 to 12 hours ago. Episode frequency: frequently. The problem has not changed since onset.Associated symptoms include abdominal pain. Pertinent negatives include no chest pain, no headaches and no shortness of breath. Nothing aggravates the symptoms. He has tried nothing for the symptoms. The treatment provided no relief.  Abdominal Pain The primary symptoms of the illness include abdominal pain and nausea. The primary symptoms of the illness do not include shortness of breath, vomiting or diarrhea. The current episode started 6 to 12 hours ago. The onset of the illness was sudden. The problem has not changed since onset. The abdominal pain began 6 to 12 hours ago. The pain came on suddenly. The abdominal pain has been unchanged since its onset. The abdominal pain is located in the left flank. The abdominal pain radiates to the suprapubic region. The severity of the abdominal pain is 10/10. The abdominal pain is relieved by nothing. Exacerbated by: nothing.  Nausea began yesterday. Associated with: nothing.  The patient has not had a change in bowel habit. Risk factors for an acute abdominal problem include being elderly. Symptoms associated with the illness do not include urgency or frequency. Significant associated medical issues include diabetes.    Past Medical History  Diagnosis Date  . Hypertension   . Hyperlipidemia   . Diabetes mellitus   . Other testicular hypofunction     Past Surgical History  Procedure Date  . Nasal sinus  surgery   . Mouth surgery   . Mouth surgery 2012    with removal of a hard palate boney deformith    Family History  Problem Relation Age of Onset  . Arthritis Mother   . Alcohol abuse Brother   . Cancer Brother   . Alcohol abuse Brother   . Cancer Brother   . Hypertension Brother   . Diabetes Neg Hx   . Heart disease Neg Hx     History  Substance Use Topics  . Smoking status: Never Smoker   . Smokeless tobacco: Current User     Comment: counselled  . Alcohol Use: 1.0 oz/week    2 drink(s) per week     Comment: couple of beers      Review of Systems  Respiratory: Negative for shortness of breath.   Cardiovascular: Negative for chest pain.  Gastrointestinal: Positive for nausea and abdominal pain. Negative for vomiting and diarrhea.  Genitourinary: Positive for flank pain. Negative for urgency and frequency.  Neurological: Negative for headaches.  All other systems reviewed and are negative.    Allergies  Review of patient's allergies indicates no known allergies.  Home Medications   Current Outpatient Rx  Name  Route  Sig  Dispense  Refill  . AMITRIPTYLINE HCL 25 MG PO TABS   Oral   Take 1 tablet (25 mg total) by mouth at bedtime as needed for sleep.   30 tablet   11   . GLIMEPIRIDE 4 MG PO TABS      TAKE 1 TABLET (4 MG TOTAL) BY MOUTH DAILY BEFORE BREAKFAST.  30 tablet   3   . LISINOPRIL 20 MG PO TABS   Oral   Take 1 tablet (20 mg total) by mouth daily.   30 tablet   11     Overdue for yearly physical must see md B4 additio ...   . METFORMIN HCL 1000 MG PO TABS   Oral   Take 1 tablet (1,000 mg total) by mouth 2 (two) times daily with a meal.   60 tablet   11   . SIMVASTATIN 20 MG PO TABS   Oral   Take 1 tablet (20 mg total) by mouth at bedtime.   30 tablet   11     Overdue for yearly physical must see md b4 additio ...   . ANDROGEL PUMP TD   Transdermal   Place onto the skin. 1% gel; apply daily           BP 160/94  Pulse 82   Temp 98.4 F (36.9 C) (Oral)  Resp 16  Wt 187 lb (84.823 kg)  SpO2 99%  Physical Exam  Constitutional: He is oriented to person, place, and time. He appears well-developed and well-nourished. No distress.  HENT:  Head: Normocephalic and atraumatic.  Mouth/Throat: Oropharynx is clear and moist.  Eyes: Conjunctivae normal are normal. Pupils are equal, round, and reactive to light.  Neck: Normal range of motion. Neck supple.  Cardiovascular: Normal rate and regular rhythm.   Pulmonary/Chest: Effort normal and breath sounds normal. He has no wheezes. He has no rales.  Abdominal: Soft. Bowel sounds are normal. There is no tenderness. There is no rebound and no guarding.  Musculoskeletal: Normal range of motion.  Neurological: He is alert and oriented to person, place, and time.  Skin: Skin is warm and dry.  Psychiatric: He has a normal mood and affect.    ED Course  Procedures (including critical care time)  Labs Reviewed  URINALYSIS, ROUTINE W REFLEX MICROSCOPIC - Abnormal; Notable for the following:    Hgb urine dipstick MODERATE (*)     All other components within normal limits  CBC WITH DIFFERENTIAL - Abnormal; Notable for the following:    HCT 38.3 (*)     All other components within normal limits  BASIC METABOLIC PANEL - Abnormal; Notable for the following:    Glucose, Bld 138 (*)     Creatinine, Ser 1.50 (*)     GFR calc non Af Amer 50 (*)     GFR calc Af Amer 58 (*)     All other components within normal limits  URINE MICROSCOPIC-ADD ON - Abnormal; Notable for the following:    Bacteria, UA FEW (*)     All other components within normal limits  URINE CULTURE   Ct Abdomen Pelvis Wo Contrast  05/16/2012  *RADIOLOGY REPORT*  Clinical Data: Left flank pain  CT ABDOMEN AND PELVIS WITHOUT CONTRAST  Technique:  Multidetector CT imaging of the abdomen and pelvis was performed following the standard protocol without intravenous contrast.  Comparison: None.  Findings: Limited  images through the lung bases demonstrate no significant appreciable abnormality. The heart size is within normal limits. No pleural or pericardial effusion.  Organ abnormality/lesion detection is limited in the absence of intravenous contrast. Within this limitation, unremarkable liver, spleen, pancreas, biliary system, left adrenal gland. Indeterminate 1.4-cm right adrenal nodule.  Mildly lobular renal contours.  Nonobstructing left renal stones within the lower pole.  There is mild hydroureteronephrosis on the left to the level of a  distal ureteral stone measuring 4 mm.  No bowel obstruction.  No CT evidence for colitis.  Normal appendix.  No free intraperitoneal air or fluid.  No lymphadenopathy.  Normal caliber aorta and branch vessels.  Partially decompressed bladder.  Small fat containing inguinal hernias.  Enlarged prostate gland. Soft tissue within the left inguinal canal is favored to reflect a high riding position to the left testicle.  No acute osseous finding.  IMPRESSION: Mild left hydroureteronephrosis to the level of a 4 mm distal ureteral stone.  Nonobstructing lower pole left renal stones.  Indeterminate 1.4-cm right adrenal nodule.  Recommend non emergent adrenal protocol MRI or CT.  Prostatomegaly.   Original Report Authenticated By: Jearld Lesch, M.D.      No diagnosis found.    MDM  Kidney stone.  Pain free post meds.  Will prescribe strainer flomax, and pain meds follow up with Dr. Retta Diones in 7 days, strain all urine.  Follow up with Dr. Debby Bud about obtaining an outpatient abdominal MRI to evaluate adrenal nodule seen on CT scan.  Patient verbalizes understanding and agrees to follow up.           Jasmine Awe, MD 05/16/12 0236

## 2012-05-17 LAB — URINE CULTURE: Culture: NO GROWTH

## 2012-06-27 DIAGNOSIS — Z87898 Personal history of other specified conditions: Secondary | ICD-10-CM | POA: Insufficient documentation

## 2012-06-27 HISTORY — DX: Personal history of other specified conditions: Z87.898

## 2012-07-17 ENCOUNTER — Other Ambulatory Visit: Payer: Self-pay | Admitting: Internal Medicine

## 2012-08-14 ENCOUNTER — Emergency Department (HOSPITAL_BASED_OUTPATIENT_CLINIC_OR_DEPARTMENT_OTHER)
Admission: EM | Admit: 2012-08-14 | Discharge: 2012-08-15 | Disposition: A | Discharge status: Home / Self Care | Payer: BC Managed Care – PPO | Attending: Emergency Medicine | Admitting: Emergency Medicine

## 2012-08-14 ENCOUNTER — Encounter (HOSPITAL_BASED_OUTPATIENT_CLINIC_OR_DEPARTMENT_OTHER): Payer: Self-pay | Admitting: *Deleted

## 2012-08-14 DIAGNOSIS — A5400 Gonococcal infection of lower genitourinary tract, unspecified: Secondary | ICD-10-CM | POA: Insufficient documentation

## 2012-08-14 DIAGNOSIS — R339 Retention of urine, unspecified: Secondary | ICD-10-CM

## 2012-08-14 DIAGNOSIS — Z8639 Personal history of other endocrine, nutritional and metabolic disease: Secondary | ICD-10-CM | POA: Insufficient documentation

## 2012-08-14 DIAGNOSIS — Z87448 Personal history of other diseases of urinary system: Secondary | ICD-10-CM | POA: Insufficient documentation

## 2012-08-14 DIAGNOSIS — E119 Type 2 diabetes mellitus without complications: Secondary | ICD-10-CM | POA: Insufficient documentation

## 2012-08-14 DIAGNOSIS — Z79899 Other long term (current) drug therapy: Secondary | ICD-10-CM | POA: Insufficient documentation

## 2012-08-14 DIAGNOSIS — I10 Essential (primary) hypertension: Secondary | ICD-10-CM | POA: Insufficient documentation

## 2012-08-14 DIAGNOSIS — Z862 Personal history of diseases of the blood and blood-forming organs and certain disorders involving the immune mechanism: Secondary | ICD-10-CM | POA: Insufficient documentation

## 2012-08-14 DIAGNOSIS — R300 Dysuria: Secondary | ICD-10-CM | POA: Insufficient documentation

## 2012-08-14 DIAGNOSIS — E785 Hyperlipidemia, unspecified: Secondary | ICD-10-CM | POA: Insufficient documentation

## 2012-08-14 HISTORY — DX: Benign prostatic hyperplasia without lower urinary tract symptoms: N40.0

## 2012-08-14 LAB — URINALYSIS, ROUTINE W REFLEX MICROSCOPIC
Glucose, UA: NEGATIVE mg/dL
Ketones, ur: 15 mg/dL — AB
Leukocytes, UA: NEGATIVE
Protein, ur: 30 mg/dL — AB

## 2012-08-14 LAB — URINE MICROSCOPIC-ADD ON

## 2012-08-14 MED ORDER — AZITHROMYCIN 250 MG PO TABS
1000.0000 mg | ORAL_TABLET | Freq: Once | ORAL | Status: AC
  Administered 2012-08-14: 1000 mg via ORAL
  Filled 2012-08-14: qty 4

## 2012-08-14 NOTE — ED Notes (Signed)
Pt unable to urinate at this time.  

## 2012-08-14 NOTE — ED Notes (Signed)
MD at bedside. 

## 2012-08-14 NOTE — ED Provider Notes (Signed)
History     CSN: [id number]  Arrival date & time 08/14/12  2214   First MD Initiated Contact with Patient 08/14/12 2255      Chief Complaint  Patient presents with  . Urinary Retention    (Consider location/radiation/quality/duration/timing/severity/associated sxs/prior treatment) HPI This is a 57 year old male with a two-day history of dysuria and penile discharge. He was started on ciprofloxacin 2 days ago. He got no relief and earlier today was seen in urgent care where he was diagnosed with gonorrhea. He was given a shot of an antibiotic, presumably Rocephin. He was not given Zithromax pending lab results. He has had difficulty urinating since yesterday and since 1 PM today has not been able urinate at all. His bladder is very distended and he is experiencing moderate to severe discomfort as a result. He states he had a fever to 102 on Monday, 2 days ago, as well as chills. He denies nausea or vomiting. He has known BPH.  Past Medical History  Diagnosis Date  . Hypertension   . Hyperlipidemia   . Diabetes mellitus   . Other testicular hypofunction     Past Surgical History  Procedure Laterality Date  . Nasal sinus surgery    . Mouth surgery    . Mouth surgery  2012    with removal of a hard palate boney deformith    Family History  Problem Relation Age of Onset  . Arthritis Mother   . Alcohol abuse Brother   . Cancer Brother   . Alcohol abuse Brother   . Cancer Brother   . Hypertension Brother   . Diabetes Neg Hx   . Heart disease Neg Hx     History  Substance Use Topics  . Smoking status: Never Smoker   . Smokeless tobacco: Current User     Comment: counselled  . Alcohol Use: 1.0 oz/week    2 drink(s) per week     Comment: couple of beers      Review of Systems  All other systems reviewed and are negative.    Allergies  Review of patient's allergies indicates no known allergies.  Home Medications   Current Outpatient Rx  Name  Route  Sig   Dispense  Refill  . amitriptyline (ELAVIL) 25 MG tablet   Oral   Take 1 tablet (25 mg total) by mouth at bedtime as needed for sleep.   30 tablet   11   . glimepiride (AMARYL) 4 MG tablet      TAKE 1 TABLET (4 MG TOTAL) BY MOUTH DAILY BEFORE BREAKFAST.   30 tablet   0     Need HGBA1c checked for further refills.   Marland Kitchen lisinopril (PRINIVIL,ZESTRIL) 20 MG tablet   Oral   Take 1 tablet (20 mg total) by mouth daily.   30 tablet   11     Overdue for yearly physical must see md B4 additio ...   . metFORMIN (GLUCOPHAGE) 1000 MG tablet   Oral   Take 1 tablet (1,000 mg total) by mouth 2 (two) times daily with a meal.   60 tablet   11   . ondansetron (ZOFRAN ODT) 8 MG disintegrating tablet      8mg  ODT q8 hours prn nausea   6 tablet   0   . Testosterone (ANDROGEL PUMP TD)   Transdermal   Place onto the skin. 1% gel; apply daily         . oxyCODONE-acetaminophen (PERCOCET) 5-325 MG per tablet  Oral   Take 1 tablet by mouth every 6 (six) hours as needed for pain.   10 tablet   0   . simvastatin (ZOCOR) 20 MG tablet   Oral   Take 1 tablet (20 mg total) by mouth at bedtime.   30 tablet   11     Overdue for yearly physical must see md b4 additio ...   . Tamsulosin HCl (FLOMAX) 0.4 MG CAPS   Oral   Take 1 capsule (0.4 mg total) by mouth daily.   7 capsule   0     BP 154/85  Pulse 99  Temp(Src) 99 F (37.2 C) (Oral)  Resp 17  Ht 6' (1.829 m)  Wt 184 lb (83.462 kg)  BMI 24.95 kg/m2  SpO2 98%  Physical Exam General: Well-developed, well-nourished male in no acute distress; appearance consistent with age of record HENT: normocephalic, atraumatic Eyes: pupils equal round and reactive to light; extraocular muscles intact; arcus senilis bilaterally Neck: supple Heart: regular rate and rhythm Lungs: clear to auscultation bilaterally Abdomen: soft; distended, tender bladder; ; bowel sounds present GU: Tanner 4 male, uncircumcised; no urethral discharge; prostate  is significantly enlarged but minimally tender; distended, full bladder seen on bedside ultrasound Extremities: No deformity; full range of motion Neurologic: Awake, alert and oriented; motor function intact in all extremities and symmetric; no facial droop Skin: Warm and dry Psychiatric: Normal mood and affect    ED Course  Procedures (including critical care time)     MDM   Nursing notes and vitals signs, including pulse oximetry, reviewed.  Summary of this visit's results, reviewed by myself:  Labs:  Results for orders placed during the hospital encounter of 08/14/12 (from the past 24 hour(s))  URINALYSIS, ROUTINE W REFLEX MICROSCOPIC     Status: Abnormal   Collection Time    08/14/12 11:18 PM      Result Value Range   Color, Urine AMBER (*) YELLOW   APPearance CLOUDY (*) CLEAR   Specific Gravity, Urine 1.027  1.005 - 1.030   pH 5.0  5.0 - 8.0   Glucose, UA NEGATIVE  NEGATIVE mg/dL   Hgb urine dipstick MODERATE (*) NEGATIVE   Bilirubin Urine SMALL (*) NEGATIVE   Ketones, ur 15 (*) NEGATIVE mg/dL   Protein, ur 30 (*) NEGATIVE mg/dL   Urobilinogen, UA 1.0  0.0 - 1.0 mg/dL   Nitrite NEGATIVE  NEGATIVE   Leukocytes, UA NEGATIVE  NEGATIVE  URINE MICROSCOPIC-ADD ON     Status: None   Collection Time    08/14/12 11:18 PM      Result Value Range   Squamous Epithelial / LPF RARE  RARE   WBC, UA 0-2  <3 WBC/hpf   RBC / HPF 0-2  <3 RBC/hpf   Bacteria, UA RARE  RARE      11:25 PM Two-day catheter placed by nursing staff. 600 mL of straw-colored urine obtained. Patient experienced significant relief.  11:45 PM We will give the patient 1 g of Zithromax this is not covered for chlamydia. We will send him home with leg bag and refer him to his urologist.        Hanley Seamen, MD 08/14/12 [phone]

## 2012-08-14 NOTE — ED Notes (Signed)
Pt c/o urinary retention and dysuria since Sunday. Pt was seen by MD today and dx'd with STD but still having problems.

## 2012-08-16 LAB — GC/CHLAMYDIA PROBE AMP
CT Probe RNA: NEGATIVE
GC Probe RNA: NEGATIVE

## 2012-08-19 ENCOUNTER — Other Ambulatory Visit: Payer: Self-pay | Admitting: Internal Medicine

## 2012-08-20 NOTE — Telephone Encounter (Signed)
Med filled.  

## 2012-09-10 ENCOUNTER — Other Ambulatory Visit: Payer: Self-pay | Admitting: Internal Medicine

## 2012-09-16 ENCOUNTER — Other Ambulatory Visit: Payer: Self-pay | Admitting: Internal Medicine

## 2012-09-17 NOTE — Telephone Encounter (Signed)
Rx request to pharmacy-*PATIENT NEEDS OFFICE VISIT*/[facility]

## 2012-09-20 ENCOUNTER — Other Ambulatory Visit: Payer: Self-pay | Admitting: Internal Medicine

## 2012-10-19 ENCOUNTER — Other Ambulatory Visit: Payer: Self-pay | Admitting: Internal Medicine

## 2012-10-22 ENCOUNTER — Other Ambulatory Visit: Payer: Self-pay | Admitting: Internal Medicine

## 2012-11-16 ENCOUNTER — Other Ambulatory Visit (INDEPENDENT_AMBULATORY_CARE_PROVIDER_SITE_OTHER): Payer: BC Managed Care – PPO

## 2012-11-16 DIAGNOSIS — E119 Type 2 diabetes mellitus without complications: Secondary | ICD-10-CM

## 2012-11-16 LAB — HEMOGLOBIN A1C: Hgb A1c MFr Bld: 6 % (ref 4.6–6.5)

## 2012-11-21 ENCOUNTER — Telehealth: Payer: Self-pay

## 2012-11-21 NOTE — Telephone Encounter (Signed)
Phone call to patient, could not leave a message due to voice mail box not set up. Will try again later

## 2012-11-21 NOTE — Telephone Encounter (Signed)
Message copied by Noreene Larsson on Wed Nov 21, 2012  8:11 AM ------      Message from: Illene Regulus E      Created: Wed Nov 21, 2012  5:13 AM       Please call the patient: great control with an A1C 6% !!! Good job ------

## 2012-11-22 ENCOUNTER — Other Ambulatory Visit (INDEPENDENT_AMBULATORY_CARE_PROVIDER_SITE_OTHER): Payer: BC Managed Care – PPO

## 2012-11-22 ENCOUNTER — Ambulatory Visit (INDEPENDENT_AMBULATORY_CARE_PROVIDER_SITE_OTHER): Payer: BC Managed Care – PPO | Admitting: Internal Medicine

## 2012-11-22 ENCOUNTER — Encounter: Payer: Self-pay | Admitting: Internal Medicine

## 2012-11-22 VITALS — BP 160/90 | HR 80 | Temp 97.9°F | Ht 71.0 in | Wt 181.8 lb

## 2012-11-22 DIAGNOSIS — E785 Hyperlipidemia, unspecified: Secondary | ICD-10-CM

## 2012-11-22 DIAGNOSIS — R972 Elevated prostate specific antigen [PSA]: Secondary | ICD-10-CM

## 2012-11-22 DIAGNOSIS — I10 Essential (primary) hypertension: Secondary | ICD-10-CM

## 2012-11-22 DIAGNOSIS — E278 Other specified disorders of adrenal gland: Secondary | ICD-10-CM

## 2012-11-22 DIAGNOSIS — E291 Testicular hypofunction: Secondary | ICD-10-CM

## 2012-11-22 DIAGNOSIS — Z1211 Encounter for screening for malignant neoplasm of colon: Secondary | ICD-10-CM

## 2012-11-22 DIAGNOSIS — E119 Type 2 diabetes mellitus without complications: Secondary | ICD-10-CM

## 2012-11-22 DIAGNOSIS — E279 Disorder of adrenal gland, unspecified: Secondary | ICD-10-CM

## 2012-11-22 DIAGNOSIS — Z0000 Encounter for general adult medical examination without abnormal findings: Secondary | ICD-10-CM

## 2012-11-22 LAB — LIPID PANEL
Cholesterol: 122 mg/dL (ref 0–200)
VLDL: 17.6 mg/dL (ref 0.0–40.0)

## 2012-11-22 LAB — COMPREHENSIVE METABOLIC PANEL
AST: 20 U/L (ref 0–37)
Alkaline Phosphatase: 59 U/L (ref 39–117)
BUN: 22 mg/dL (ref 6–23)
Creatinine, Ser: 1.4 mg/dL (ref 0.4–1.5)
Glucose, Bld: 79 mg/dL (ref 70–99)

## 2012-11-22 LAB — HEPATIC FUNCTION PANEL
ALT: 22 U/L (ref 0–53)
AST: 20 U/L (ref 0–37)
Alkaline Phosphatase: 59 U/L (ref 39–117)
Bilirubin, Direct: 0 mg/dL (ref 0.0–0.3)
Total Bilirubin: 0.4 mg/dL (ref 0.3–1.2)
Total Protein: 7.3 g/dL (ref 6.0–8.3)

## 2012-11-22 LAB — MICROALBUMIN / CREATININE URINE RATIO
Creatinine,U: 200.9 mg/dL
Microalb Creat Ratio: 1.6 mg/g (ref 0.0–30.0)
Microalb, Ur: 3.3 mg/dL — ABNORMAL HIGH (ref 0.0–1.9)

## 2012-11-22 MED ORDER — LISINOPRIL-HYDROCHLOROTHIAZIDE 20-25 MG PO TABS: 1.0000 | ORAL_TABLET | Freq: Every day | ORAL | Status: DC

## 2012-11-22 MED ORDER — DUTASTERIDE 0.5 MG PO CAPS: 0.5000 mg | ORAL_CAPSULE | Freq: Every day | ORAL | Status: DC

## 2012-11-22 MED ORDER — GLIMEPIRIDE 4 MG PO TABS: 4.0000 mg | ORAL_TABLET | Freq: Every day | ORAL | Status: DC

## 2012-11-22 MED ORDER — AMITRIPTYLINE HCL 25 MG PO TABS: 25.0000 mg | ORAL_TABLET | Freq: Every evening | ORAL | Status: DC | PRN

## 2012-11-22 MED ORDER — SIMVASTATIN 20 MG PO TABS: 20.0000 mg | ORAL_TABLET | Freq: Every day | ORAL | Status: DC

## 2012-11-22 MED ORDER — METFORMIN HCL 1000 MG PO TABS: 1000.0000 mg | ORAL_TABLET | Freq: Two times a day (BID) | ORAL | Status: DC

## 2012-11-22 NOTE — Progress Notes (Signed)
Subjective:    Patient ID: Joe Wise, male    DOB: [birth year], 57 y.o.   MRN: [id number]  HPI Joe Wise presents for a routine medical exam. In the interval: November '13 nephrolithiasis - 4 mm by CT scan at [facility] - passed spontaneously; Jan '14 urinary obstruction/retention secondary to possible prostatitis and use of decongestants. Had a foley for a week. Seen at [facility] - treated for a week with Rapaflo x 1 week. He has had no subsequent problems.   He has h/o enlarged prostate and had an elevated PSA in the past leading to biopsy that was negative  X 12. He continues to follow with Dr. Retta Diones. He is now on Avodart and has good flow.   He admits to tinea pedis which he self treats with otc antifungal cream.  He is current with his dentist. He has regular eye exams with one coming up soon. No h/o retinal disease or other ophthalmologic problems.  Past Medical History  Diagnosis Date  . Hypertension   . Hyperlipidemia   . Diabetes mellitus   . Other testicular hypofunction   . BPH (benign prostatic hyperplasia)   . Chronic kidney disease Nov '13    nephrolithiasis 4mm left - passed  . H/O urinary retention Jan '14    secondary to decongestants - resolved.   Past Surgical History  Procedure Laterality Date  . Nasal sinus surgery    . Mouth surgery    . 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 Arthritis Mother   . Alcohol abuse Brother   . Cancer Brother   . Alcohol abuse Brother   . Cancer Brother   . Hypertension Brother   . Diabetes Neg Hx   . Heart disease Neg Hx    History   Social History  . Marital Status: Divorced    Spouse Name: N/A    Number of Children: 2  . Years of Education: 14   Occupational History  . load operator    Social History Main Topics  . Smoking status: Never Smoker   . Smokeless tobacco: Current User     Comment: counselled  . Alcohol Use:  1.0 oz/week    2 drink(s) per week     Comment: couple of beers  . Drug Use: No  . Sexually Active: Yes -- Male partner(s)   Other Topics Concern  . Not on file   Social History Narrative   HS[employer], [employer] -2 years. Married '83-seperated/divorced '06. 1 son '81; 1 daughter ''37; 1 grandchild (girl) '06. 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   Current Outpatient Prescriptions on File Prior to Visit  Medication Sig Dispense Refill  . glimepiride (AMARYL) 4 MG tablet TAKE 1 TABLET BEFORE BREAKFAST  30 tablet  2  . lisinopril (PRINIVIL,ZESTRIL) 20 MG tablet Take 1 tablet (20 mg total) by mouth daily.  30 tablet  11  . metFORMIN (GLUCOPHAGE) 1000 MG tablet Take 1 tablet (1,000 mg total) by mouth 2 (two) times daily with a meal.  60 tablet  11  . oxyCODONE-acetaminophen (PERCOCET) 5-325 MG per tablet Take 1 tablet by mouth every 6 (six) hours as needed for pain.  10 tablet  0  . simvastatin (ZOCOR) 20 MG tablet Take 1 tablet (20 mg total) by mouth at bedtime.  30 tablet  11  . Testosterone (ANDROGEL PUMP TD) Place onto  the skin. 1% gel; apply daily      . amitriptyline (ELAVIL) 25 MG tablet Take 1 tablet (25 mg total) by mouth at bedtime as needed for sleep.  30 tablet  11   No current facility-administered medications on file prior to visit.       Review of Systems Constitutional:  Negative for fever, chills, activity change and unexpected weight change.  HEENT:  Negative for hearing loss, ear pain, congestion, neck stiffness and postnasal drip. Negative for sore throat or swallowing problems. Negative for dental complaints.   Eyes: Negative for vision loss or change in visual acuity.  Respiratory: Negative for chest tightness and wheezing. Negative for DOE.   Cardiovascular: Negative for chest pain or palpitations. No decreased exercise tolerance Gastrointestinal: No change in bowel habit. No bloating or gas. No reflux or indigestion Genitourinary: Negative for urgency,  frequency, flank pain and difficulty urinating.  Musculoskeletal: Negative for myalgias, back pain, arthralgias and gait problem.  Neurological: Negative for dizziness, tremors, weakness and headaches.  Hematological: Negative for adenopathy.  Psychiatric/Behavioral: Negative for behavioral problems and dysphoric mood.       Objective:   Physical Exam Filed Vitals:   11/22/12 0847  BP: 160/90  Pulse: 80  Temp: 97.9 F (36.6 C)   Wt Readings from Last 3 Encounters:  11/22/12 181 lb 12.8 oz (82.464 kg)  08/14/12 184 lb (83.462 kg)  05/16/12 187 lb (84.823 kg)   Gen'l: Well nourished well developed AA male in no acute distress  HEENT: Head: Normocephalic and atraumatic. Right Ear: External ear normal. EAC/TM nl. Left Ear: External ear normal.  EAC/TM nl. Nose: Nose normal. Mouth/Throat: Oropharynx is clear and moist. Dentition - native, in good repair. No buccal or palatal lesions. Posterior pharynx clear. Eyes: Conjunctivae and sclera clear. EOM intact. Pupils are equal, round, and reactive to light. Right eye exhibits no discharge. Left eye exhibits no discharge. Neck: Normal range of motion. Neck supple. No JVD present. No tracheal deviation present. No thyromegaly present.  Cardiovascular: Normal rate, regular rhythm, no gallop, no friction rub, no murmur heard.      Quiet precordium. 2+ radial and DP pulses . No carotid bruits Pulmonary/Chest: Effort normal. No respiratory distress or increased WOB, no wheezes, no rales. No chest wall deformity or CVAT. Abdomen: Soft. Bowel sounds are normal in all quadrants. He exhibits no distension, no tenderness, no rebound or guarding, No heptosplenomegaly  Genitourinary:   Musculoskeletal: Normal range of motion. He exhibits no edema and no tenderness.       Small and large joints without redness, synovial thickening or deformity. Full range of motion preserved about all small, median and large joints.  Lymphadenopathy:    He has no cervical  or supraclavicular adenopathy.  Neurological: He is alert and oriented to person, place, and time. CN II-XII intact. DTRs 2+ and symmetrical biceps, radial and patellar tendons. Cerebellar function normal with no tremor, rigidity, normal gait and station. Left foot - normal pulse, normal sensation to light touch, pin prick and vibration. Skin: Skin is warm and dry. No rash noted. No erythema.  Psychiatric: He has a normal mood and affect. His behavior is normal. Thought content normal.   Recent Results (from the past 2160 hour(s))  HEMOGLOBIN A1C     Status: None   Collection Time    11/16/12  8:29 AM      Result Value Range   Hemoglobin A1C 6.0  4.6 - 6.5 %   Comment: Glycemic Control  Guidelines for People with Diabetes:Non Diabetic:  <6%Goal of Therapy: <7%Additional Action Suggested:  >8%   LIPID PANEL     Status: None   Collection Time    11/22/12  9:43 AM      Result Value Range   Cholesterol 122  0 - 200 mg/dL   Comment: ATP III Classification       Desirable:  < 200 mg/dL               Borderline High:  200 - 239 mg/dL          High:  > = 161 mg/dL   Triglycerides 09.6  0.0 - 149.0 mg/dL   Comment: Normal:  <045 mg/dLBorderline High:  150 - 199 mg/dL   HDL 40.98  >11.91 mg/dL   VLDL 47.8  0.0 - 29.5 mg/dL   LDL Cholesterol 64  0 - 99 mg/dL   Total CHOL/HDL Ratio 3     Comment:                Men          Women1/2 Average Risk     3.4          3.3Average Risk          5.0          4.42X Average Risk          9.6          7.13X Average Risk          15.0          11.0                      HEPATIC FUNCTION PANEL     Status: None   Collection Time    11/22/12  9:43 AM      Result Value Range   Total Bilirubin 0.4  0.3 - 1.2 mg/dL   Bilirubin, Direct 0.0  0.0 - 0.3 mg/dL   Alkaline Phosphatase 59  39 - 117 U/L   AST 20  0 - 37 U/L   ALT 22  0 - 53 U/L   Total Protein 7.3  6.0 - 8.3 g/dL   Albumin 4.4  3.5 - 5.2 g/dL  COMPREHENSIVE METABOLIC PANEL     Status: None   Collection  Time    11/22/12  9:43 AM      Result Value Range   Sodium 140  135 - 145 mEq/L   Potassium 4.6  3.5 - 5.1 mEq/L   Chloride 106  96 - 112 mEq/L   CO2 28  19 - 32 mEq/L   Glucose, Bld 79  70 - 99 mg/dL   BUN 22  6 - 23 mg/dL   Creatinine, Ser 1.4  0.4 - 1.5 mg/dL   Total Bilirubin 0.4  0.3 - 1.2 mg/dL   Alkaline Phosphatase 59  39 - 117 U/L   AST 20  0 - 37 U/L   ALT 22  0 - 53 U/L   Total Protein 7.3  6.0 - 8.3 g/dL   Albumin 4.4  3.5 - 5.2 g/dL   Calcium 9.2  8.4 - 62.1 mg/dL   GFR 30.86  >57.84 mL/min  MICROALBUMIN / CREATININE URINE RATIO     Status: Abnormal   Collection Time    11/22/12  9:43 AM      Result Value Range   Microalb, Ur 3.3 (*) 0.0 - 1.9 mg/dL  Creatinine,U 200.9     Microalb Creat Ratio 1.6  0.0 - 30.0 mg/g         Assessment & Plan:

## 2012-11-22 NOTE — Patient Instructions (Addendum)
Thanks for coming to see me.  1. Prostate - with the use of avodart if your REALLY need a decongestant for a bad cold we can use very low dose sudafed, e.g. 30 mg twice a day.  2. Diabetes - great control with A1C 6%. Continue present medication. When you get your eye exam have the doctor send me a note.  3. Colon cancer screening - order has been placed. Be sure to ask that when the study is approved that there is coverage for the "facility" charge!!!  4. 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 Plan - CT scan to be sure there has been no change in the last 6 months. If stable will check again 12 months later.  5. Blood pressure -  BP Readings from Last 3 Encounters:  11/22/12 160/90  08/14/12 154/85  05/16/12 160/94   Too high and seems this is a pattern.  Plan - will change medication to lisinopril/hct   Either have BP check here in 2-4 weeks or send me a report of outside readings.   5. General health - looking good and doing good. Try during hot weather to change your sock in the middle of the work day to help keep the athletes foot at bay.  Come in 6 months for a quick check on your diabetes.

## 2012-11-23 NOTE — Telephone Encounter (Signed)
Pt seen in office 529/14

## 2012-11-25 DIAGNOSIS — E278 Other specified disorders of adrenal gland: Secondary | ICD-10-CM | POA: Insufficient documentation

## 2012-11-25 DIAGNOSIS — E279 Disorder of adrenal gland, unspecified: Secondary | ICD-10-CM

## 2012-11-25 HISTORY — DX: Disorder of adrenal gland, unspecified: E27.9

## 2012-11-25 NOTE — Assessment & Plan Note (Signed)
H/o elevated PSA but workup was negative.  PLan  f/u Dr. Retta Diones

## 2012-11-25 NOTE — Assessment & Plan Note (Signed)
Doing well. No hypoglycemic episodes. Current with eye exams. Has normal foot exam.  Plan - follow up A1C  Addendum - A1C 6%. Will continue present medications

## 2012-11-25 NOTE — Assessment & Plan Note (Signed)
Incidental finding when he was worked up for kidney stone with CT Nov '13. He is asymptomatic   Plan - follow up CT

## 2012-11-25 NOTE — Assessment & Plan Note (Signed)
Continues to use Androgel and is monitored by urology

## 2012-11-25 NOTE — Assessment & Plan Note (Signed)
BP Readings from Last 3 Encounters:  11/22/12 160/90  08/14/12 154/85  05/16/12 160/94   suboptimal control.  Plan - will add HCTZ to ACE-I.  Follow-up BP monitoring

## 2012-11-25 NOTE — Assessment & Plan Note (Signed)
Tolerating medication and watching his diet.  Plan - routine follow up lab.  Addendum: normal iver functions. LDL at 62 is well below goal. Great control !!!

## 2012-11-25 NOTE — Assessment & Plan Note (Signed)
Interval history notable for kidney stone. He had an incidental finding of a adrenal nodule - 1.4 cm. He will need f/u CT to assess for change. He has not symptoms. He is referred for routine colorectal cancer screening. He is followed by Dr. Retta Diones for prostate health. Immunizations are current.  For mild T. Pedis - advised to change socks during the day and to continue with antifungal cream as needed.  In summary - a very nice man who has his cholesterol and diabetes under good control. He will report back on BP control with the addition of HCTZ to his regimen. He will need routine follow up in 6 months.

## 2012-11-26 ENCOUNTER — Other Ambulatory Visit: Payer: Self-pay | Admitting: Internal Medicine

## 2012-11-26 ENCOUNTER — Ambulatory Visit (INDEPENDENT_AMBULATORY_CARE_PROVIDER_SITE_OTHER)
Admission: RE | Admit: 2012-11-26 | Discharge: 2012-11-26 | Disposition: A | Discharge status: Home / Self Care | Payer: BC Managed Care – PPO | Source: Ambulatory Visit | Attending: Internal Medicine | Admitting: Internal Medicine

## 2012-11-26 DIAGNOSIS — E278 Other specified disorders of adrenal gland: Secondary | ICD-10-CM

## 2012-11-26 DIAGNOSIS — E279 Disorder of adrenal gland, unspecified: Secondary | ICD-10-CM

## 2012-11-26 MED ORDER — IOHEXOL 300 MG/ML  SOLN
100.0000 mL | Freq: Once | INTRAMUSCULAR | Status: AC | PRN
  Administered 2012-11-26: 100 mL via INTRAVENOUS

## 2012-11-30 ENCOUNTER — Encounter: Payer: Self-pay | Admitting: Internal Medicine

## 2013-07-11 ENCOUNTER — Other Ambulatory Visit: Payer: Self-pay

## 2013-07-11 MED ORDER — METFORMIN HCL 1000 MG PO TABS
1000.0000 mg | ORAL_TABLET | Freq: Two times a day (BID) | ORAL | Status: DC
Start: 2013-07-11 — End: 2013-11-25

## 2013-09-10 ENCOUNTER — Encounter: Payer: Self-pay | Admitting: Internal Medicine

## 2013-11-19 ENCOUNTER — Other Ambulatory Visit: Payer: Self-pay | Admitting: *Deleted

## 2013-11-19 MED ORDER — LISINOPRIL-HYDROCHLOROTHIAZIDE 20-25 MG PO TABS: 1.0000 | ORAL_TABLET | Freq: Every day | ORAL | Status: DC

## 2013-11-19 MED ORDER — SIMVASTATIN 20 MG PO TABS: 20.0000 mg | ORAL_TABLET | Freq: Every day | ORAL | Status: DC

## 2013-11-19 MED ORDER — GLIMEPIRIDE 4 MG PO TABS: 4.0000 mg | ORAL_TABLET | Freq: Every day | ORAL | Status: DC

## 2013-11-19 NOTE — Telephone Encounter (Signed)
Pls sch OV w/a new PCP See Rx Thx

## 2013-11-19 NOTE — Telephone Encounter (Signed)
Rf req for amitriptyline 25 mg 1 po qhs. # 30. Last filled 10/17/13. Ok to Rf?

## 2013-11-20 MED ORDER — AMITRIPTYLINE HCL 25 MG PO TABS: 25.0000 mg | ORAL_TABLET | Freq: Every evening | ORAL | Status: DC | PRN

## 2013-11-20 NOTE — Telephone Encounter (Signed)
Done. Called pt- no answer/unable to leave message. Will have schedulers contact pt to schedule OV.

## 2013-11-21 ENCOUNTER — Other Ambulatory Visit: Payer: Self-pay | Admitting: Internal Medicine

## 2013-11-25 ENCOUNTER — Encounter: Payer: Self-pay | Admitting: Internal Medicine

## 2013-11-25 ENCOUNTER — Encounter: Payer: BC Managed Care – PPO | Admitting: Internal Medicine

## 2013-11-25 ENCOUNTER — Ambulatory Visit (INDEPENDENT_AMBULATORY_CARE_PROVIDER_SITE_OTHER): Payer: BC Managed Care – PPO | Admitting: Internal Medicine

## 2013-11-25 VITALS — BP 110/94 | HR 76 | Temp 98.5°F | Resp 16 | Ht 71.0 in | Wt 180.0 lb

## 2013-11-25 DIAGNOSIS — R972 Elevated prostate specific antigen [PSA]: Secondary | ICD-10-CM

## 2013-11-25 DIAGNOSIS — Z0000 Encounter for general adult medical examination without abnormal findings: Secondary | ICD-10-CM

## 2013-11-25 DIAGNOSIS — I10 Essential (primary) hypertension: Secondary | ICD-10-CM

## 2013-11-25 DIAGNOSIS — E278 Other specified disorders of adrenal gland: Secondary | ICD-10-CM

## 2013-11-25 DIAGNOSIS — E279 Disorder of adrenal gland, unspecified: Secondary | ICD-10-CM

## 2013-11-25 DIAGNOSIS — E119 Type 2 diabetes mellitus without complications: Secondary | ICD-10-CM

## 2013-11-25 DIAGNOSIS — E291 Testicular hypofunction: Secondary | ICD-10-CM

## 2013-11-25 MED ORDER — METFORMIN HCL 1000 MG PO TABS: 1000.0000 mg | ORAL_TABLET | Freq: Two times a day (BID) | ORAL | Status: DC

## 2013-11-25 MED ORDER — VITAMIN D 1000 UNITS PO TABS: 1000.0000 [IU] | ORAL_TABLET | Freq: Every day | ORAL | Status: AC

## 2013-11-25 MED ORDER — LISINOPRIL-HYDROCHLOROTHIAZIDE 20-25 MG PO TABS: 1.0000 | ORAL_TABLET | Freq: Every day | ORAL | Status: DC

## 2013-11-25 MED ORDER — AMITRIPTYLINE HCL 25 MG PO TABS: 25.0000 mg | ORAL_TABLET | Freq: Every evening | ORAL | Status: DC | PRN

## 2013-11-25 MED ORDER — GLIMEPIRIDE 4 MG PO TABS: 4.0000 mg | ORAL_TABLET | Freq: Every day | ORAL | Status: DC

## 2013-11-25 MED ORDER — SIMVASTATIN 20 MG PO TABS: 20.0000 mg | ORAL_TABLET | Freq: Every day | ORAL | Status: DC

## 2013-11-25 NOTE — Progress Notes (Signed)
Subjective:    HPI  New pt - transfer from Dr MEN  The patient is here for a wellness exam. The patient has been doing well overall without major physical or psychological issues going on lately. The patient needs to address  chronic hypertension that has been well controlled with medicines; to address chronic  hyperlipidemia controlled with medicines as well; and to address type 2 chronic diabetes, controlled with medical treatment and diet. F/u hypogonadism and adrenal gland CT finding...  BP Readings from Last 3 Encounters:  11/25/13 110/94  11/22/12 160/90  08/14/12 154/85   Wt Readings from Last 3 Encounters:  11/25/13 180 lb (81.647 kg)  11/22/12 181 lb 12.8 oz (82.464 kg)  08/14/12 184 lb (83.462 kg)      Review of Systems  Constitutional: Negative for appetite change, fatigue and unexpected weight change.  HENT: Negative for congestion, nosebleeds, sneezing, sore throat and trouble swallowing.   Eyes: Negative for itching and visual disturbance.  Respiratory: Negative for cough.   Cardiovascular: Negative for chest pain, palpitations and leg swelling.  Gastrointestinal: Negative for nausea, diarrhea, blood in stool and abdominal distention.  Genitourinary: Negative for frequency and hematuria.  Musculoskeletal: Negative for back pain, gait problem, joint swelling and neck pain.  Skin: Negative for rash.  Neurological: Negative for dizziness, tremors, speech difficulty and weakness.  Psychiatric/Behavioral: Negative for sleep disturbance, dysphoric mood and agitation. The patient is not nervous/anxious.        Objective:   Physical Exam  Constitutional: He is oriented to person, place, and time. He appears well-developed and well-nourished. No distress.  HENT:  Head: Normocephalic and atraumatic.  Right Ear: External ear normal.  Left Ear: External ear normal.  Nose: Nose normal.  Mouth/Throat: Oropharynx is clear and moist. No oropharyngeal exudate.  Eyes:  Conjunctivae and EOM are normal. Pupils are equal, round, and reactive to light. Right eye exhibits no discharge. Left eye exhibits no discharge. No scleral icterus.  Neck: Normal range of motion. Neck supple. No JVD present. No tracheal deviation present. No thyromegaly present.  Cardiovascular: Normal rate, regular rhythm, normal heart sounds and intact distal pulses.  Exam reveals no gallop and no friction rub.   No murmur heard. Pulmonary/Chest: Effort normal and breath sounds normal. No stridor. No respiratory distress. He has no wheezes. He has no rales. He exhibits no tenderness.  Abdominal: Soft. Bowel sounds are normal. He exhibits no distension and no mass. There is no tenderness. There is no rebound and no guarding.  Genitourinary: Rectum normal, prostate normal and penis normal. Guaiac negative stool. No penile tenderness.  Musculoskeletal: Normal range of motion. He exhibits no edema and no tenderness.  Lymphadenopathy:    He has no cervical adenopathy.  Neurological: He is alert and oriented to person, place, and time. He has normal reflexes. No cranial nerve deficit. He exhibits normal muscle tone. Coordination normal.  Skin: Skin is warm and dry. No rash noted. He is not diaphoretic. No erythema. No pallor.  Psychiatric: He has a normal mood and affect. His behavior is normal. Judgment and thought content normal.    Lab Results  Component Value Date   WBC 7.1 05/16/2012   HGB 13.1 05/16/2012   HCT 38.3* 05/16/2012   PLT 207 05/16/2012   GLUCOSE 79 11/22/2012   CHOL 122 11/22/2012   TRIG 88.0 11/22/2012   HDL 40.70 11/22/2012   LDLCALC 64 11/22/2012   ALT 22 11/22/2012   ALT 22 11/22/2012   AST 20  11/22/2012   AST 20 11/22/2012   NA 140 11/22/2012   K 4.6 11/22/2012   CL 106 11/22/2012   CREATININE 1.4 11/22/2012   BUN 22 11/22/2012   CO2 28 11/22/2012   TSH 1.41 09/03/2010   PSA 6.12* 09/03/2010   HGBA1C 6.0 11/16/2012   MICROALBUR 3.3* 11/22/2012         Assessment & Plan:

## 2013-11-25 NOTE — Progress Notes (Signed)
Pre visit review using our clinic review tool, if applicable. No additional management support is needed unless otherwise documented below in the visit note. 

## 2013-12-01 ENCOUNTER — Encounter: Payer: Self-pay | Admitting: Internal Medicine

## 2013-12-01 NOTE — Assessment & Plan Note (Signed)
Potential benefits of a long term testosterone use as well as potential risks  and complications were explained to the patient and were aknowledged. 

## 2013-12-01 NOTE — Assessment & Plan Note (Signed)
IMPRESSION:  16 mm right adrenal nodule is unchanged in size comparing  05/16/2012. Absolute and relative washout characteristics of the  nodule are most suggestive of benign adrenal adenoma. However, the  precontrast attenuation of 41 HU is higher than typically seen for  adenoma and some reports suggest a higher incidence of malignancy  associated with nodules having an attenuation above 43 Hounsfield  units on precontrast imaging, regardless of the washout  characteristics. While the 7 months of size stability is  reassuring, follow-up imaging is recommended. Repeat imaging in 9-  12 months could be used to insure continued stability and MRI with  in and out of phase imaging at that time to assess for  intralesional lipid content would likely prove helpful.  Tiny nonobstructing left renal stone.  Original Report Authenticated By: Kennith Center, M.D.          Result Notes    Notes Recorded by Jacques Navy, MD on 11/30/2012 at 10:13 AM    Due CT in 11/2012 Will sch

## 2013-12-01 NOTE — Assessment & Plan Note (Signed)
Continue with current prescription therapy as reflected on the Med list.  

## 2013-12-01 NOTE — Assessment & Plan Note (Signed)
We discussed age appropriate health related issues, including available/recomended screening tests and vaccinations. We discussed a need for adhering to healthy diet and exercise. Labs/EKG were reviewed/ordered. All questions were answered.   

## 2013-12-01 NOTE — Assessment & Plan Note (Signed)
Free PSA 

## 2013-12-05 ENCOUNTER — Inpatient Hospital Stay: Admission: RE | Admit: 2013-12-05 | Payer: BC Managed Care – PPO | Source: Ambulatory Visit

## 2013-12-24 LAB — COLOGUARD

## 2013-12-25 ENCOUNTER — Telehealth: Payer: Self-pay | Admitting: *Deleted

## 2013-12-25 ENCOUNTER — Encounter: Payer: Self-pay | Admitting: *Deleted

## 2013-12-25 DIAGNOSIS — E119 Type 2 diabetes mellitus without complications: Secondary | ICD-10-CM

## 2013-12-25 LAB — COLOGUARD: COLOGUARD: NEGATIVE

## 2013-12-25 NOTE — Telephone Encounter (Signed)
Unable to reach patient due to voice mail not set up.  Lipid, bmet, a1c ordered.

## 2013-12-25 NOTE — Telephone Encounter (Signed)
Letter sent to patient asking him to give the office a call

## 2014-01-03 ENCOUNTER — Other Ambulatory Visit (INDEPENDENT_AMBULATORY_CARE_PROVIDER_SITE_OTHER): Payer: BC Managed Care – PPO

## 2014-01-03 DIAGNOSIS — E119 Type 2 diabetes mellitus without complications: Secondary | ICD-10-CM

## 2014-01-03 LAB — BASIC METABOLIC PANEL
BUN: 25 mg/dL — ABNORMAL HIGH (ref 6–23)
CHLORIDE: 103 meq/L (ref 96–112)
CO2: 33 mEq/L — ABNORMAL HIGH (ref 19–32)
CREATININE: 1.5 mg/dL (ref 0.4–1.5)
Calcium: 9.2 mg/dL (ref 8.4–10.5)
GFR: 62.09 mL/min (ref >60.00)
Glucose, Bld: 100 mg/dL — ABNORMAL HIGH (ref 70–99)
POTASSIUM: 4.1 meq/L (ref 3.5–5.1)
Sodium: 141 mEq/L (ref 135–145)

## 2014-01-03 LAB — LIPID PANEL
CHOL/HDL RATIO: 3
Cholesterol: 126 mg/dL (ref 0–200)
HDL: 41.9 mg/dL (ref >39.00)
LDL Cholesterol: 60 mg/dL (ref 0–99)
NONHDL: 84.1
Triglycerides: 120 mg/dL (ref 0.0–149.0)
VLDL: 24 mg/dL (ref 0.0–40.0)

## 2014-01-03 LAB — HEMOGLOBIN A1C: HEMOGLOBIN A1C: 7.6 % — AB (ref 4.6–6.5)

## 2014-01-07 ENCOUNTER — Telehealth: Payer: Self-pay | Admitting: *Deleted

## 2014-01-07 NOTE — Telephone Encounter (Signed)
I called pt re: 12/24/2013 Cologuard colon cancer screening results. Results are negative. No answer/unable to leave message. Will try again later.

## 2014-01-08 ENCOUNTER — Encounter: Payer: Self-pay | Admitting: *Deleted

## 2014-01-08 NOTE — Telephone Encounter (Signed)
Called pt again still no answer x's 10 rings. No vm mailing out pt results letter with results...Raechel Chute/lmb

## 2014-02-24 ENCOUNTER — Other Ambulatory Visit: Payer: Self-pay

## 2014-02-24 MED ORDER — METFORMIN HCL 1000 MG PO TABS: 1000.0000 mg | ORAL_TABLET | Freq: Two times a day (BID) | ORAL | Status: DC

## 2014-02-27 ENCOUNTER — Encounter: Payer: Self-pay | Admitting: Internal Medicine

## 2014-03-06 ENCOUNTER — Telehealth: Payer: Self-pay

## 2014-03-06 NOTE — Telephone Encounter (Signed)
Re: called to schedule Nurse Visit to recheck Blood Pressure 

## 2014-03-31 ENCOUNTER — Ambulatory Visit: Payer: BC Managed Care – PPO | Admitting: Internal Medicine

## 2014-05-01 ENCOUNTER — Ambulatory Visit (INDEPENDENT_AMBULATORY_CARE_PROVIDER_SITE_OTHER): Payer: BC Managed Care – PPO | Admitting: Internal Medicine

## 2014-05-01 ENCOUNTER — Encounter: Payer: Self-pay | Admitting: Internal Medicine

## 2014-05-01 ENCOUNTER — Other Ambulatory Visit: Payer: BC Managed Care – PPO

## 2014-05-01 VITALS — BP 130/82 | HR 82 | Temp 98.3°F | Wt 183.0 lb

## 2014-05-01 DIAGNOSIS — R972 Elevated prostate specific antigen [PSA]: Secondary | ICD-10-CM

## 2014-05-01 DIAGNOSIS — E119 Type 2 diabetes mellitus without complications: Secondary | ICD-10-CM

## 2014-05-01 DIAGNOSIS — Z23 Encounter for immunization: Secondary | ICD-10-CM

## 2014-05-01 DIAGNOSIS — E278 Other specified disorders of adrenal gland: Secondary | ICD-10-CM

## 2014-05-01 DIAGNOSIS — E279 Disorder of adrenal gland, unspecified: Secondary | ICD-10-CM

## 2014-05-01 DIAGNOSIS — I10 Essential (primary) hypertension: Secondary | ICD-10-CM

## 2014-05-01 NOTE — Assessment & Plan Note (Signed)
Continue with current prescription therapy as reflected on the Med list. Labs  

## 2014-05-01 NOTE — Progress Notes (Signed)
Pre visit review using our clinic review tool, if applicable. No additional management support is needed unless otherwise documented below in the visit note. 

## 2014-05-01 NOTE — Progress Notes (Signed)
Patient ID: Joe LemmingDarrell C Pottle, male   DOB: 12/13/1955, 58 y.o.   MRN: [id number]   Subjective:    HPI   The patient needs to address  chronic hypertension that has been well controlled with medicines; to address chronic  hyperlipidemia controlled with medicines as well; and to address type 2 chronic diabetes, controlled with medical treatment and diet. F/u hypogonadism and adrenal gland CT finding...  BP Readings from Last 3 Encounters:  05/01/14 130/82  11/25/13 110/94  11/22/12 160/90   Wt Readings from Last 3 Encounters:  05/01/14 183 lb (83.008 kg)  11/25/13 180 lb (81.647 kg)  11/22/12 181 lb 12.8 oz (82.464 kg)      Review of Systems  Constitutional: Negative for appetite change, fatigue and unexpected weight change.  HENT: Negative for congestion, nosebleeds, sneezing, sore throat and trouble swallowing.   Eyes: Negative for itching and visual disturbance.  Respiratory: Negative for cough.   Cardiovascular: Negative for chest pain, palpitations and leg swelling.  Gastrointestinal: Negative for nausea, diarrhea, blood in stool and abdominal distention.  Genitourinary: Negative for frequency and hematuria.  Musculoskeletal: Negative for back pain, joint swelling, gait problem and neck pain.  Skin: Negative for rash.  Neurological: Negative for dizziness, tremors, speech difficulty and weakness.  Psychiatric/Behavioral: Negative for sleep disturbance, dysphoric mood and agitation. The patient is not nervous/anxious.        Objective:   Physical Exam  Constitutional: He is oriented to person, place, and time. He appears well-developed. No distress.  NAD  HENT:  Mouth/Throat: Oropharynx is clear and moist.  Eyes: Conjunctivae are normal. Pupils are equal, round, and reactive to light.  Neck: Normal range of motion. No JVD present. No thyromegaly present.  Cardiovascular: Normal rate, regular rhythm, normal heart sounds and intact distal pulses.  Exam reveals no gallop  and no friction rub.   No murmur heard. Pulmonary/Chest: Effort normal and breath sounds normal. No respiratory distress. He has no wheezes. He has no rales. He exhibits no tenderness.  Abdominal: Soft. Bowel sounds are normal. He exhibits no distension and no mass. There is no tenderness. There is no rebound and no guarding.  Musculoskeletal: Normal range of motion. He exhibits no edema or tenderness.  Lymphadenopathy:    He has no cervical adenopathy.  Neurological: He is alert and oriented to person, place, and time. He has normal reflexes. No cranial nerve deficit. He exhibits normal muscle tone. He displays a negative Romberg sign. Coordination and gait normal.  No meningeal signs  Skin: Skin is warm and dry. No rash noted.  Psychiatric: He has a normal mood and affect. His behavior is normal. Judgment and thought content normal.    Lab Results  Component Value Date   WBC 7.1 05/16/2012   HGB 13.1 05/16/2012   HCT 38.3* 05/16/2012   PLT 207 05/16/2012   GLUCOSE 100* 01/03/2014   CHOL 126 01/03/2014   TRIG 120.0 01/03/2014   HDL 41.90 01/03/2014   LDLCALC 60 01/03/2014   ALT 22 11/22/2012   ALT 22 11/22/2012   AST 20 11/22/2012   AST 20 11/22/2012   NA 141 01/03/2014   K 4.1 01/03/2014   CL 103 01/03/2014   CREATININE 1.5 01/03/2014   BUN 25* 01/03/2014   CO2 33* 01/03/2014   TSH 1.41 09/03/2010   PSA 6.12* 09/03/2010   HGBA1C 7.6* 01/03/2014   MICROALBUR 3.3* 11/22/2012         Assessment & Plan:

## 2014-05-01 NOTE — Assessment & Plan Note (Signed)
Dr Dahlstedt q 6 mo 

## 2014-05-01 NOTE — Assessment & Plan Note (Signed)
Continue with current prescription therapy as reflected on the Med list. NAS diet 

## 2014-05-01 NOTE — Assessment & Plan Note (Signed)
Reschedule abd CT

## 2014-05-02 ENCOUNTER — Telehealth: Payer: Self-pay | Admitting: Internal Medicine

## 2014-05-02 NOTE — Telephone Encounter (Signed)
emmi mailed  °

## 2014-06-26 ENCOUNTER — Other Ambulatory Visit: Payer: Self-pay | Admitting: Internal Medicine

## 2014-06-26 DIAGNOSIS — E279 Disorder of adrenal gland, unspecified: Principal | ICD-10-CM

## 2014-06-26 DIAGNOSIS — E278 Other specified disorders of adrenal gland: Secondary | ICD-10-CM

## 2014-07-23 ENCOUNTER — Other Ambulatory Visit: Payer: Self-pay | Admitting: *Deleted

## 2014-07-23 DIAGNOSIS — E279 Disorder of adrenal gland, unspecified: Principal | ICD-10-CM

## 2014-07-23 DIAGNOSIS — E278 Other specified disorders of adrenal gland: Secondary | ICD-10-CM

## 2014-08-05 LAB — HM DIABETES EYE EXAM

## 2014-08-13 ENCOUNTER — Encounter: Payer: Self-pay | Admitting: Internal Medicine

## 2014-09-01 ENCOUNTER — Encounter: Payer: Self-pay | Admitting: Internal Medicine

## 2014-09-01 ENCOUNTER — Ambulatory Visit (INDEPENDENT_AMBULATORY_CARE_PROVIDER_SITE_OTHER): Payer: BLUE CROSS/BLUE SHIELD | Admitting: Internal Medicine

## 2014-09-01 ENCOUNTER — Other Ambulatory Visit (INDEPENDENT_AMBULATORY_CARE_PROVIDER_SITE_OTHER): Payer: BLUE CROSS/BLUE SHIELD

## 2014-09-01 VITALS — BP 120/78 | HR 90 | Temp 98.6°F | Wt 188.0 lb

## 2014-09-01 DIAGNOSIS — E278 Other specified disorders of adrenal gland: Secondary | ICD-10-CM

## 2014-09-01 DIAGNOSIS — M25511 Pain in right shoulder: Secondary | ICD-10-CM

## 2014-09-01 DIAGNOSIS — M25519 Pain in unspecified shoulder: Secondary | ICD-10-CM | POA: Insufficient documentation

## 2014-09-01 DIAGNOSIS — E119 Type 2 diabetes mellitus without complications: Secondary | ICD-10-CM

## 2014-09-01 DIAGNOSIS — E785 Hyperlipidemia, unspecified: Secondary | ICD-10-CM

## 2014-09-01 DIAGNOSIS — I10 Essential (primary) hypertension: Secondary | ICD-10-CM

## 2014-09-01 DIAGNOSIS — E279 Disorder of adrenal gland, unspecified: Secondary | ICD-10-CM

## 2014-09-01 HISTORY — DX: Pain in unspecified shoulder: M25.519

## 2014-09-01 LAB — BASIC METABOLIC PANEL
BUN: 30 mg/dL — ABNORMAL HIGH (ref 6–23)
CO2: 32 mEq/L (ref 19–32)
Calcium: 9.7 mg/dL (ref 8.4–10.5)
Chloride: 101 mEq/L (ref 96–112)
Creatinine, Ser: 1.87 mg/dL — ABNORMAL HIGH (ref 0.40–1.50)
GFR: 47.67 mL/min — AB (ref >60.00)
GLUCOSE: 130 mg/dL — AB (ref 70–99)
POTASSIUM: 3.7 meq/L (ref 3.5–5.1)
SODIUM: 140 meq/L (ref 135–145)

## 2014-09-01 LAB — HEPATIC FUNCTION PANEL
ALBUMIN: 4.7 g/dL (ref 3.5–5.2)
ALK PHOS: 48 U/L (ref 39–117)
ALT: 25 U/L (ref 0–53)
AST: 25 U/L (ref 0–37)
Bilirubin, Direct: 0.1 mg/dL (ref 0.0–0.3)
Total Bilirubin: 0.5 mg/dL (ref 0.2–1.2)
Total Protein: 7.3 g/dL (ref 6.0–8.3)

## 2014-09-01 LAB — LIPID PANEL
CHOL/HDL RATIO: 3
CHOLESTEROL: 122 mg/dL (ref 0–200)
HDL: 40.4 mg/dL (ref >39.00)
LDL Cholesterol: 61 mg/dL (ref 0–99)
NonHDL: 81.6
TRIGLYCERIDES: 103 mg/dL (ref 0.0–149.0)
VLDL: 20.6 mg/dL (ref 0.0–40.0)

## 2014-09-01 LAB — HEMOGLOBIN A1C: Hgb A1c MFr Bld: 7.8 % — ABNORMAL HIGH (ref 4.6–6.5)

## 2014-09-01 NOTE — Progress Notes (Signed)
Patient ID: Joe Wise, male   DOB: 06/14/1956, 59 y.o.   MRN: [id number]   Subjective:    HPI   The patient needs to address  chronic hypertension that has been well controlled with medicines; to address chronic  hyperlipidemia controlled with medicines as well; and to address type 2 chronic diabetes, controlled with medical treatment and diet. 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  HENT: Negative for congestion, nosebleeds, sneezing, sore throat and trouble swallowing.   Eyes: Negative for itching and visual disturbance.  Respiratory: Negative for cough.   Cardiovascular: Negative for chest pain, palpitations and leg swelling.  Gastrointestinal: Negative for nausea, diarrhea, blood in stool and abdominal distention.  Genitourinary: Negative for frequency and hematuria.  Musculoskeletal: Negative for back pain, joint swelling, gait problem and neck pain.  Skin: Negative for rash.  Neurological: Negative for dizziness, tremors, speech difficulty and weakness.  Psychiatric/Behavioral: Negative for sleep disturbance, dysphoric mood and agitation. The patient is not nervous/anxious.        Objective:   Physical Exam  Constitutional: He is oriented to person, place, and time. He appears well-developed. No distress.  NAD  HENT:  Mouth/Throat: Oropharynx is clear and moist.  Eyes: Conjunctivae are normal. Pupils are equal, round, and reactive to light.  Neck: Normal range of motion. No JVD present. No thyromegaly present.  Cardiovascular: Normal rate, regular  rhythm, normal heart sounds and intact distal pulses.  Exam reveals no gallop and no friction rub.   No murmur heard. Pulmonary/Chest: Effort normal and breath sounds normal. No respiratory distress. He has no wheezes. He has no rales. He exhibits no tenderness.  Abdominal: Soft. Bowel sounds are normal. He exhibits no distension and no mass. There is no tenderness. There is no rebound and no guarding.  Musculoskeletal: Normal range of motion. He exhibits no edema or tenderness.  Lymphadenopathy:    He has no cervical adenopathy.  Neurological: He is alert and oriented to person, place, and time. He has normal reflexes. No cranial nerve deficit. He exhibits normal muscle tone. He displays a negative Romberg sign. Coordination and gait normal.  No meningeal signs  Skin: Skin is warm and dry. No rash noted.  Psychiatric: He has a normal mood and affect. His behavior is normal. Judgment and thought content normal.  R trap is tense, tender  Lab Results  Component Value Date   WBC 7.1 05/16/2012   HGB 13.1 05/16/2012   HCT 38.3* 05/16/2012   PLT 207 05/16/2012   GLUCOSE 100* 01/03/2014   CHOL 126 01/03/2014   TRIG 120.0 01/03/2014   HDL 41.90 01/03/2014   LDLCALC 60 01/03/2014   ALT 22 11/22/2012   ALT 22 11/22/2012   AST 20 11/22/2012   AST 20 11/22/2012   NA 141 01/03/2014   K 4.1 01/03/2014   CL 103 01/03/2014   CREATININE 1.5 01/03/2014   BUN 25* 01/03/2014   CO2 33* 01/03/2014   TSH 1.41 09/03/2010   PSA 6.12* 09/03/2010   HGBA1C 7.6* 01/03/2014  MICROALBUR 3.3* 11/22/2012    IMPRESSION: 16 mm right adrenal nodule is unchanged in size comparing 05/16/2012. Absolute and relative washout characteristics of the nodule are most suggestive of benign adrenal adenoma. However, the precontrast attenuation of 41 HU is higher than typically seen for adenoma and some reports suggest a higher incidence of malignancy associated with nodules having an attenuation above 43  Hounsfield units on precontrast imaging, regardless of the washout characteristics. While the 7 months of size stability is reassuring, follow-up imaging is recommended. Repeat imaging in 9- 12 months could be used to insure continued stability and MRI with in and out of phase imaging at that time to assess for intralesional lipid content would likely prove helpful.  Tiny nonobstructing left renal stone.   Original Report Authenticated By: Kennith CenterEric Mansell, M.D.     Assessment & Plan:

## 2014-09-01 NOTE — Assessment & Plan Note (Signed)
Pt is on metformin, sulfonylurea (Amaryl)

## 2014-09-01 NOTE — Assessment & Plan Note (Signed)
Stretch Adjust chair height

## 2014-09-01 NOTE — Assessment & Plan Note (Signed)
CT IMPRESSION: 16 mm right adrenal nodule is unchanged in size comparing 05/16/2012. Absolute and relative washout characteristics of the nodule are most suggestive of benign adrenal adenoma. However, the precontrast attenuation of 41 HU is higher than typically seen for adenoma and some reports suggest a higher incidence of malignancy associated with nodules having an attenuation above 43 Hounsfield units on precontrast imaging, regardless of the washout characteristics. While the 7 months of size stability is reassuring, follow-up imaging is recommended. Repeat imaging in 9- 12 months could be used to insure continued stability and MRI with in and out of phase imaging at that time to assess for intralesional lipid content would likely prove helpful.  Tiny nonobstructing left renal stone.   Original Report Authenticated By: Kennith CenterEric Mansell, M.D.  MRI is being scheduled - pt has been n/a for the test: will re-schedule

## 2014-09-01 NOTE — Progress Notes (Signed)
Pre visit review using our clinic review tool, if applicable. No additional management support is needed unless otherwise documented below in the visit note. 

## 2014-09-01 NOTE — Assessment & Plan Note (Signed)
On meds - ACE-I, diuretic (Prinizide)

## 2014-09-01 NOTE — Assessment & Plan Note (Signed)
Med - zocor 20 mg daily - well tolerated w/o adverse side effects

## 2014-09-04 ENCOUNTER — Encounter: Payer: Self-pay | Admitting: Internal Medicine

## 2014-09-10 ENCOUNTER — Ambulatory Visit
Admission: RE | Admit: 2014-09-10 | Discharge: 2014-09-10 | Disposition: A | Discharge status: Home / Self Care | Payer: BLUE CROSS/BLUE SHIELD | Source: Ambulatory Visit | Attending: Internal Medicine | Admitting: Internal Medicine

## 2014-09-10 DIAGNOSIS — E278 Other specified disorders of adrenal gland: Secondary | ICD-10-CM

## 2014-09-10 DIAGNOSIS — E279 Disorder of adrenal gland, unspecified: Principal | ICD-10-CM

## 2014-09-17 ENCOUNTER — Telehealth: Payer: Self-pay

## 2014-09-17 NOTE — Telephone Encounter (Signed)
See result note.  

## 2014-10-02 ENCOUNTER — Encounter: Payer: Self-pay | Admitting: Internal Medicine

## 2015-01-01 ENCOUNTER — Other Ambulatory Visit: Payer: Self-pay

## 2015-01-01 MED ORDER — METFORMIN HCL 1000 MG PO TABS: 1000.0000 mg | ORAL_TABLET | Freq: Two times a day (BID) | ORAL | Status: DC

## 2015-01-01 MED ORDER — LISINOPRIL-HYDROCHLOROTHIAZIDE 20-25 MG PO TABS: 1.0000 | ORAL_TABLET | Freq: Every day | ORAL | Status: DC

## 2015-01-01 MED ORDER — SIMVASTATIN 20 MG PO TABS: 20.0000 mg | ORAL_TABLET | Freq: Every day | ORAL | Status: DC

## 2015-01-01 MED ORDER — AMITRIPTYLINE HCL 25 MG PO TABS: 25.0000 mg | ORAL_TABLET | Freq: Every evening | ORAL | Status: DC | PRN

## 2015-01-01 MED ORDER — GLIMEPIRIDE 4 MG PO TABS: 4.0000 mg | ORAL_TABLET | Freq: Every day | ORAL | Status: DC

## 2015-10-28 ENCOUNTER — Ambulatory Visit (INDEPENDENT_AMBULATORY_CARE_PROVIDER_SITE_OTHER): Payer: BLUE CROSS/BLUE SHIELD | Admitting: Internal Medicine

## 2015-10-28 ENCOUNTER — Encounter: Payer: Self-pay | Admitting: Internal Medicine

## 2015-10-28 ENCOUNTER — Other Ambulatory Visit (INDEPENDENT_AMBULATORY_CARE_PROVIDER_SITE_OTHER): Payer: BLUE CROSS/BLUE SHIELD

## 2015-10-28 VITALS — BP 138/88 | HR 78 | Ht 71.0 in | Wt 189.0 lb

## 2015-10-28 DIAGNOSIS — E1122 Type 2 diabetes mellitus with diabetic chronic kidney disease: Secondary | ICD-10-CM

## 2015-10-28 DIAGNOSIS — E279 Disorder of adrenal gland, unspecified: Secondary | ICD-10-CM | POA: Diagnosis not present

## 2015-10-28 DIAGNOSIS — R972 Elevated prostate specific antigen [PSA]: Secondary | ICD-10-CM | POA: Diagnosis not present

## 2015-10-28 DIAGNOSIS — E278 Other specified disorders of adrenal gland: Secondary | ICD-10-CM

## 2015-10-28 DIAGNOSIS — Z0001 Encounter for general adult medical examination with abnormal findings: Secondary | ICD-10-CM | POA: Diagnosis not present

## 2015-10-28 DIAGNOSIS — Z0000 Encounter for general adult medical examination without abnormal findings: Secondary | ICD-10-CM

## 2015-10-28 DIAGNOSIS — R21 Rash and other nonspecific skin eruption: Secondary | ICD-10-CM | POA: Insufficient documentation

## 2015-10-28 DIAGNOSIS — I10 Essential (primary) hypertension: Secondary | ICD-10-CM

## 2015-10-28 DIAGNOSIS — J309 Allergic rhinitis, unspecified: Secondary | ICD-10-CM | POA: Insufficient documentation

## 2015-10-28 DIAGNOSIS — E119 Type 2 diabetes mellitus without complications: Secondary | ICD-10-CM | POA: Diagnosis not present

## 2015-10-28 HISTORY — DX: Allergic rhinitis, unspecified: J30.9

## 2015-10-28 HISTORY — DX: Rash and other nonspecific skin eruption: R21

## 2015-10-28 LAB — BASIC METABOLIC PANEL
BUN: 19 mg/dL (ref 6–23)
CO2: 28 mEq/L (ref 19–32)
CREATININE: 1.66 mg/dL — AB (ref 0.40–1.50)
Calcium: 9.3 mg/dL (ref 8.4–10.5)
Chloride: 99 mEq/L (ref 96–112)
GFR: 54.48 mL/min — AB (ref >60.00)
Glucose, Bld: 329 mg/dL — ABNORMAL HIGH (ref 70–99)
POTASSIUM: 3.9 meq/L (ref 3.5–5.1)
Sodium: 136 mEq/L (ref 135–145)

## 2015-10-28 LAB — LIPID PANEL
Cholesterol: 149 mg/dL (ref 0–200)
HDL: 40.4 mg/dL (ref >39.00)
LDL Cholesterol: 78 mg/dL (ref 0–99)
NONHDL: 108.38
Total CHOL/HDL Ratio: 4
Triglycerides: 150 mg/dL — ABNORMAL HIGH (ref 0.0–149.0)
VLDL: 30 mg/dL (ref 0.0–40.0)

## 2015-10-28 LAB — HEMOGLOBIN A1C: HEMOGLOBIN A1C: 10.7 % — AB (ref 4.6–6.5)

## 2015-10-28 LAB — HEPATIC FUNCTION PANEL
ALK PHOS: 53 U/L (ref 39–117)
ALT: 26 U/L (ref 0–53)
AST: 18 U/L (ref 0–37)
Albumin: 4.2 g/dL (ref 3.5–5.2)
BILIRUBIN TOTAL: 0.5 mg/dL (ref 0.2–1.2)
Bilirubin, Direct: 0.1 mg/dL (ref 0.0–0.3)
Total Protein: 6.7 g/dL (ref 6.0–8.3)

## 2015-10-28 MED ORDER — MOMETASONE FUROATE 50 MCG/ACT NA SUSP: 2.0000 | Freq: Every day | NASAL | Status: AC

## 2015-10-28 MED ORDER — HALOBETASOL PROPIONATE 0.05 % EX CREA: TOPICAL_CREAM | Freq: Two times a day (BID) | CUTANEOUS | Status: DC

## 2015-10-28 MED ORDER — LORATADINE 10 MG PO TABS: 10.0000 mg | ORAL_TABLET | Freq: Every day | ORAL | Status: AC

## 2015-10-28 NOTE — Assessment & Plan Note (Signed)
Dr Retta Dionesahlstedt q 6 mo. H/o bx

## 2015-10-28 NOTE — Assessment & Plan Note (Signed)
Pt is on metformin, sulfonylurea (Amaryl)

## 2015-10-28 NOTE — Assessment & Plan Note (Signed)
On meds - ACE-I, diuretic (Prinizide)

## 2015-10-28 NOTE — Patient Instructions (Signed)
ZOSTAVAX

## 2015-10-28 NOTE — Assessment & Plan Note (Signed)
scabbed papule on L scalp and one on L abd 5/17  ?etiol Ultravate bid Derm ref if not resolved

## 2015-10-28 NOTE — Assessment & Plan Note (Signed)
PSA - 3/17 ok per pt Dr Retta Dionesahlstedt

## 2015-10-28 NOTE — Assessment & Plan Note (Signed)
claritin  Nasonex Rx Netty pot

## 2015-10-28 NOTE — Progress Notes (Signed)
Pre visit review using our clinic review tool, if applicable. No additional management support is needed unless otherwise documented below in the visit note. 

## 2015-10-28 NOTE — Progress Notes (Signed)
Subjective:  Patient ID: Joe Wise, male    DOB: December 27, 1955  Age: 60 y.o. MRN: [id number]  CC: Annual Exam   HPI Joe Wise presents for a well exam  Outpatient Prescriptions Prior to Visit  Medication Sig Dispense Refill  . amitriptyline (ELAVIL) 25 MG tablet Take 1 tablet (25 mg total) by mouth at bedtime as needed for sleep. 90 tablet 3  . glimepiride (AMARYL) 4 MG tablet Take 1 tablet (4 mg total) by mouth daily before breakfast. 90 tablet 3  . lisinopril-hydrochlorothiazide (PRINZIDE,ZESTORETIC) 20-25 MG per tablet Take 1 tablet by mouth daily. 90 tablet 3  . metFORMIN (GLUCOPHAGE) 1000 MG tablet Take 1 tablet (1,000 mg total) by mouth 2 (two) times daily with a meal. 180 tablet 3  . simvastatin (ZOCOR) 20 MG tablet Take 1 tablet (20 mg total) by mouth at bedtime. 90 tablet 3  . Testosterone (ANDROGEL PUMP TD) Place onto the skin. 1% gel; apply daily    . dutasteride (AVODART) 0.5 MG capsule Take 1 capsule (0.5 mg total) by mouth daily. (Patient not taking: Reported on 10/28/2015)     No facility-administered medications prior to visit.    ROS Review of Systems  Constitutional: Negative for appetite change, fatigue and unexpected weight change.  HENT: Negative for congestion, nosebleeds, sneezing, sore throat and trouble swallowing.   Eyes: Negative for itching and visual disturbance.  Respiratory: Negative for cough.   Cardiovascular: Negative for chest pain, palpitations and leg swelling.  Gastrointestinal: Negative for nausea, diarrhea, blood in stool and abdominal distention.  Genitourinary: Negative for frequency and hematuria.  Musculoskeletal: Negative for back pain, joint swelling, gait problem and neck pain.  Skin: Negative for rash.  Neurological: Negative for dizziness, tremors, speech difficulty and weakness.  Psychiatric/Behavioral: Negative for suicidal ideas, sleep disturbance, dysphoric mood and agitation. The patient is not nervous/anxious.      Objective:  BP 138/88 mmHg  Pulse 78  Ht 5\' 11"  (1.803 m)  Wt 189 lb (85.73 kg)  BMI 26.37 kg/m2  SpO2 98%  BP Readings from Last 3 Encounters:  10/28/15 138/88  09/01/14 120/78  05/01/14 130/82    Wt Readings from Last 3 Encounters:  10/28/15 189 lb (85.73 kg)  09/01/14 188 lb (85.276 kg)  05/01/14 183 lb (83.008 kg)    Physical Exam  Constitutional: He is oriented to person, place, and time. He appears well-developed. No distress.  NAD  HENT:  Mouth/Throat: Oropharynx is clear and moist.  Eyes: Conjunctivae are normal. Pupils are equal, round, and reactive to light.  Neck: Normal range of motion. No JVD present. No thyromegaly present.  Cardiovascular: Normal rate, regular rhythm, normal heart sounds and intact distal pulses.  Exam reveals no gallop and no friction rub.   No murmur heard. Pulmonary/Chest: Effort normal and breath sounds normal. No respiratory distress. He has no wheezes. He has no rales. He exhibits no tenderness.  Abdominal: Soft. Bowel sounds are normal. He exhibits no distension and no mass. There is no tenderness. There is no rebound and no guarding.  Musculoskeletal: Normal range of motion. He exhibits no edema or tenderness.  Lymphadenopathy:    He has no cervical adenopathy.  Neurological: He is alert and oriented to person, place, and time. He has normal reflexes. No cranial nerve deficit. He exhibits normal muscle tone. He displays a negative Romberg sign. Coordination and gait normal.  Skin: Skin is warm and dry. No rash noted.  Psychiatric: He has a normal mood and  affect. His behavior is normal. Judgment and thought content normal.  scabbed papule on L scalp and one on L abd  Lab Results  Component Value Date   WBC 7.1 05/16/2012   HGB 13.1 05/16/2012   HCT 38.3* 05/16/2012   PLT 207 05/16/2012   GLUCOSE 130* 09/01/2014   CHOL 122 09/01/2014   TRIG 103.0 09/01/2014   HDL 40.40 09/01/2014   LDLCALC 61 09/01/2014   ALT 25 09/01/2014    AST 25 09/01/2014   NA 140 09/01/2014   K 3.7 09/01/2014   CL 101 09/01/2014   CREATININE 1.87* 09/01/2014   BUN 30* 09/01/2014   CO2 32 09/01/2014   TSH 1.41 09/03/2010   PSA 6.12* 09/03/2010   HGBA1C 7.8* 09/01/2014   MICROALBUR 3.3* 11/22/2012    Mr Abdomen Wo Contrast  09/11/2014  CLINICAL DATA:  Right adrenal mass on prior exam EXAM: MRI ABDOMEN WITHOUT CONTRAST TECHNIQUE: Multiplanar multisequence MR imaging was performed without the administration of intravenous contrast. COMPARISON:   CT 11/26/2012, [facility] CT 05/16/2012 FINDINGS: Examination performed without contrast due to renal failure. 1.5 cm right adrenal mass is reidentified. T1 weighted signal intensity measures 143. At of a signal intensity measures 127. Subjectively, there is a focus of mild T1 hyperintensity within this mass image 44 in phase imaging which demonstrates subjective drop in signal image 12 series 4 out of phase imaging, suggesting a focus of internal fat, although this is too small to be quantitatively confirmed as fat. Visualized liver, pancreas, spleen, gallbladder, left adrenal gland, and kidneys are unremarkable with the exception of sub cm right renal cortical T2 hyperintense. No ascites or lymphadenopathy. 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 Although there is no evidence for gross fat at out of phase imaging, there is a subjective focus of signal intensity loss which may indicate microscopic lipid. Due to long-term stability, and imaging features suggesting benignity, no further imaging followup is likely necessary for this benign appearing finding. Electronically Signed   By: Joe Wise M.D.   On: 09/11/2014 08:41    Assessment & Plan:   There are no diagnoses linked to this encounter. I am having Mr. Joe Wise maintain his  Testosterone (ANDROGEL PUMP TD), dutasteride, amitriptyline, glimepiride, lisinopril-hydrochlorothiazide, metFORMIN, simvastatin, and finasteride.  Meds ordered this encounter  Medications  . finasteride (PROSCAR) 5 MG tablet    Sig: Take 1 tablet by mouth daily.    Refill:  3     Follow-up: No Follow-up on file.  Sonda Primes, MD

## 2015-10-28 NOTE — Assessment & Plan Note (Signed)
We discussed age appropriate health related issues, including available/recomended screening tests and vaccinations. We discussed a need for adhering to healthy diet and exercise. Labs/EKG were reviewed/ordered. All questions were answered.   

## 2015-10-29 ENCOUNTER — Other Ambulatory Visit: Payer: Self-pay | Admitting: Internal Medicine

## 2015-10-29 DIAGNOSIS — E1110 Type 2 diabetes mellitus with ketoacidosis without coma: Secondary | ICD-10-CM

## 2015-10-29 MED ORDER — GLIMEPIRIDE 4 MG PO TABS: 4.0000 mg | ORAL_TABLET | Freq: Two times a day (BID) | ORAL | Status: DC

## 2016-01-22 ENCOUNTER — Other Ambulatory Visit: Payer: Self-pay | Admitting: Internal Medicine

## 2016-03-31 ENCOUNTER — Other Ambulatory Visit: Payer: Self-pay | Admitting: Internal Medicine

## 2016-05-03 ENCOUNTER — Other Ambulatory Visit: Payer: Self-pay | Admitting: Internal Medicine

## 2016-05-03 ENCOUNTER — Other Ambulatory Visit (INDEPENDENT_AMBULATORY_CARE_PROVIDER_SITE_OTHER): Payer: BLUE CROSS/BLUE SHIELD

## 2016-05-03 ENCOUNTER — Encounter: Payer: Self-pay | Admitting: Internal Medicine

## 2016-05-03 ENCOUNTER — Ambulatory Visit (INDEPENDENT_AMBULATORY_CARE_PROVIDER_SITE_OTHER): Payer: BLUE CROSS/BLUE SHIELD | Admitting: Internal Medicine

## 2016-05-03 DIAGNOSIS — E1165 Type 2 diabetes mellitus with hyperglycemia: Secondary | ICD-10-CM

## 2016-05-03 DIAGNOSIS — R21 Rash and other nonspecific skin eruption: Secondary | ICD-10-CM | POA: Diagnosis not present

## 2016-05-03 DIAGNOSIS — E785 Hyperlipidemia, unspecified: Secondary | ICD-10-CM

## 2016-05-03 DIAGNOSIS — E1122 Type 2 diabetes mellitus with diabetic chronic kidney disease: Secondary | ICD-10-CM | POA: Diagnosis not present

## 2016-05-03 DIAGNOSIS — E278 Other specified disorders of adrenal gland: Secondary | ICD-10-CM

## 2016-05-03 DIAGNOSIS — Z23 Encounter for immunization: Secondary | ICD-10-CM

## 2016-05-03 DIAGNOSIS — E279 Disorder of adrenal gland, unspecified: Secondary | ICD-10-CM | POA: Diagnosis not present

## 2016-05-03 DIAGNOSIS — E291 Testicular hypofunction: Secondary | ICD-10-CM

## 2016-05-03 DIAGNOSIS — I10 Essential (primary) hypertension: Secondary | ICD-10-CM

## 2016-05-03 DIAGNOSIS — E119 Type 2 diabetes mellitus without complications: Secondary | ICD-10-CM

## 2016-05-03 LAB — HEPATIC FUNCTION PANEL
ALT: 30 U/L (ref 0–53)
AST: 18 U/L (ref 0–37)
Albumin: 4.4 g/dL (ref 3.5–5.2)
Alkaline Phosphatase: 47 U/L (ref 39–117)
BILIRUBIN DIRECT: 0.1 mg/dL (ref 0.0–0.3)
BILIRUBIN TOTAL: 0.4 mg/dL (ref 0.2–1.2)
Total Protein: 7 g/dL (ref 6.0–8.3)

## 2016-05-03 LAB — HEMOGLOBIN A1C: HEMOGLOBIN A1C: 10.1 % — AB (ref 4.6–6.5)

## 2016-05-03 LAB — BASIC METABOLIC PANEL
BUN: 19 mg/dL (ref 6–23)
CALCIUM: 9.9 mg/dL (ref 8.4–10.5)
CHLORIDE: 101 meq/L (ref 96–112)
CO2: 31 mEq/L (ref 19–32)
CREATININE: 1.55 mg/dL — AB (ref 0.40–1.50)
GFR: 58.86 mL/min — ABNORMAL LOW (ref >60.00)
Glucose, Bld: 233 mg/dL — ABNORMAL HIGH (ref 70–99)
Potassium: 3.8 mEq/L (ref 3.5–5.1)
Sodium: 139 mEq/L (ref 135–145)

## 2016-05-03 LAB — LIPID PANEL
CHOLESTEROL: 164 mg/dL (ref 0–200)
HDL: 48.6 mg/dL (ref >39.00)
LDL CALC: 93 mg/dL (ref 0–99)
NONHDL: 115
Total CHOL/HDL Ratio: 3
Triglycerides: 111 mg/dL (ref 0.0–149.0)
VLDL: 22.2 mg/dL (ref 0.0–40.0)

## 2016-05-03 NOTE — Addendum Note (Signed)
Addended by: Merrilyn PumaSIMMONS, Antrell Tipler N on: 05/03/2016 08:22 AM   Modules accepted: Orders

## 2016-05-03 NOTE — Assessment & Plan Note (Signed)
Dr Retta Dionesahlstedt q 6 mo

## 2016-05-03 NOTE — Assessment & Plan Note (Signed)
On Androgel Dr Retta Dionesahlstedt

## 2016-05-03 NOTE — Assessment & Plan Note (Signed)
Healing scabbed papule on L scalp and one on L abd  ?etiol - resolving Ultravate prn

## 2016-05-03 NOTE — Assessment & Plan Note (Signed)
Labs Metformin, Amaryl 

## 2016-05-03 NOTE — Assessment & Plan Note (Signed)
-   Zocor 

## 2016-05-03 NOTE — Assessment & Plan Note (Signed)
Prinizide BP Readings from Last 3 Encounters:  05/03/16 120/76  10/28/15 138/88  09/01/14 120/78

## 2016-05-03 NOTE — Progress Notes (Signed)
Subjective:  Patient ID: Joe Wise, male    DOB: 01/11/1956  Age: 60 y.o. MRN: [id number]  CC: No chief complaint on file.   HPI Nishant C Venne presents for HTN, DM, BPH , skin lesions f/u  Outpatient Medications Prior to Visit  Medication Sig Dispense Refill  . amitriptyline (ELAVIL) 25 MG tablet TAKE 1 TABLET BY MOUTH AT BEDTIME AS NEEDED FOR SLEEP 90 tablet 3  . dutasteride (AVODART) 0.5 MG capsule Take 1 capsule (0.5 mg total) by mouth daily.    . finasteride (PROSCAR) 5 MG tablet Take 1 tablet by mouth daily.  3  . glimepiride (AMARYL) 4 MG tablet Take 1 tablet (4 mg total) by mouth 2 (two) times daily. 180 tablet 3  . glimepiride (AMARYL) 4 MG tablet TAKE 1 TABLET BY MOUTH EVERY DAY BEFORE BREAKFAST 90 tablet 3  . halobetasol (ULTRAVATE) 0.05 % cream Apply topically 2 (two) times daily. 30 g 1  . lisinopril-hydrochlorothiazide (PRINZIDE,ZESTORETIC) 20-25 MG tablet TAKE 1 TABLET BY MOUTH EVERY DAY 90 tablet 3  . loratadine (CLARITIN) 10 MG tablet Take 1 tablet (10 mg total) by mouth daily. 100 tablet 3  . metFORMIN (GLUCOPHAGE) 1000 MG tablet Take 1 tablet (1,000 mg total) by mouth 2 (two) times daily with a meal. 180 tablet 3  . mometasone (NASONEX) 50 MCG/ACT nasal spray Place 2 sprays into the nose daily. 17 g 12  . simvastatin (ZOCOR) 20 MG tablet TAKE 1 TABLET BY MOUTH AT BEDTIME 90 tablet 3  . Testosterone (ANDROGEL PUMP TD) Place onto the skin. 1% gel; apply daily     No facility-administered medications prior to visit.     ROS Review of Systems  Constitutional: Negative for appetite change, fatigue and unexpected weight change.  HENT: Negative for congestion, nosebleeds, sneezing, sore throat and trouble swallowing.   Eyes: Negative for itching and visual disturbance.  Respiratory: Negative for cough.   Cardiovascular: Negative for chest pain, palpitations and leg swelling.  Gastrointestinal: Negative for abdominal distention, blood in stool, diarrhea and  nausea.  Genitourinary: Negative for frequency and hematuria.  Musculoskeletal: Negative for back pain, gait problem, joint swelling and neck pain.  Skin: Positive for rash.  Neurological: Negative for dizziness, tremors, speech difficulty and weakness.  Psychiatric/Behavioral: Negative for agitation, dysphoric mood and sleep disturbance. The patient is not nervous/anxious.     Objective:  BP 120/76   Pulse 82   Temp 97.9 F (36.6 C)   Wt 186 lb (84.4 kg)   SpO2 97%   BMI 25.94 kg/m   BP Readings from Last 3 Encounters:  05/03/16 120/76  10/28/15 138/88  09/01/14 120/78    Wt Readings from Last 3 Encounters:  05/03/16 186 lb (84.4 kg)  10/28/15 189 lb (85.7 kg)  09/01/14 188 lb (85.3 kg)    Physical Exam  Constitutional: He is oriented to person, place, and time. He appears well-developed. No distress.  NAD  HENT:  Mouth/Throat: Oropharynx is clear and moist.  Eyes: Conjunctivae are normal. Pupils are equal, round, and reactive to light.  Neck: Normal range of motion. No JVD present. No thyromegaly present.  Cardiovascular: Normal rate, regular rhythm, normal heart sounds and intact distal pulses.  Exam reveals no gallop and no friction rub.   No murmur heard. Pulmonary/Chest: Effort normal and breath sounds normal. No respiratory distress. He has no wheezes. He has no rales. He exhibits no tenderness.  Abdominal: Soft. Bowel sounds are normal. He exhibits no distension and no  mass. There is no tenderness. There is no rebound and no guarding.  Musculoskeletal: Normal range of motion. He exhibits no edema or tenderness.  Lymphadenopathy:    He has no cervical adenopathy.  Neurological: He is alert and oriented to person, place, and time. He has normal reflexes. No cranial nerve deficit. He exhibits normal muscle tone. He displays a negative Romberg sign. Coordination and gait normal.  Skin: Skin is warm and dry. No rash noted.  Psychiatric: He has a normal mood and  affect. His behavior is normal. Judgment and thought content normal.  hyperpigm skin lesions are healing - scars  Lab Results  Component Value Date   WBC 7.1 05/16/2012   HGB 13.1 05/16/2012   HCT 38.3 (L) 05/16/2012   PLT 207 05/16/2012   GLUCOSE 329 (H) 10/28/2015   CHOL 149 10/28/2015   TRIG 150.0 (H) 10/28/2015   HDL 40.40 10/28/2015   LDLCALC 78 10/28/2015   ALT 26 10/28/2015   AST 18 10/28/2015   NA 136 10/28/2015   K 3.9 10/28/2015   CL 99 10/28/2015   CREATININE 1.66 (H) 10/28/2015   BUN 19 10/28/2015   CO2 28 10/28/2015   TSH 1.41 09/03/2010   PSA 6.12 (H) 09/03/2010   HGBA1C 10.7 (H) 10/28/2015   MICROALBUR 3.3 (H) 11/22/2012    Mr Abdomen Wo Contrast  Result Date: 09/11/2014 CLINICAL DATA:  Right adrenal mass on prior exam EXAM: MRI ABDOMEN WITHOUT CONTRAST TECHNIQUE: Multiplanar multisequence MR imaging was performed without the administration of intravenous contrast. COMPARISON:  [place] CT 11/26/2012, [facility] CT 05/16/2012 FINDINGS: Examination performed without contrast due to renal failure. 1.5 cm right adrenal mass is reidentified. T1 weighted signal intensity measures 143. At of a signal intensity measures 127. Subjectively, there is a focus of mild T1 hyperintensity within this mass image 44 in phase imaging which demonstrates subjective drop in signal image 12 series 4 out of phase imaging, suggesting a focus of internal fat, although this is too small to be quantitatively confirmed as fat. Visualized liver, pancreas, spleen, gallbladder, left adrenal gland, and kidneys are unremarkable with the exception of sub cm right renal cortical T2 hyperintense. No ascites or lymphadenopathy. 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 Although there is no evidence for gross fat at out of phase imaging, there is  a subjective focus of signal intensity loss which may indicate microscopic lipid. Due to long-term stability, and imaging features suggesting benignity, no further imaging followup is likely necessary for this benign appearing finding. Electronically Signed   By: Christiana PellantGretchen  Green M.D.   On: 09/11/2014 08:41    Assessment & Plan:   There are no diagnoses linked to this encounter. I am having Mr. Sapp maintain his Testosterone (ANDROGEL PUMP TD), dutasteride, metFORMIN, finasteride, loratadine, mometasone, halobetasol, glimepiride, amitriptyline, simvastatin, glimepiride, and lisinopril-hydrochlorothiazide.  No orders of the defined types were placed in this encounter.    Follow-up: No Follow-up on file.  Sonda PrimesAlex Plotnikov, MD

## 2016-05-03 NOTE — Progress Notes (Signed)
Pre visit review using our clinic review tool, if applicable. No additional management support is needed unless otherwise documented below in the visit note. 

## 2016-05-12 ENCOUNTER — Encounter: Payer: Self-pay | Admitting: Endocrinology

## 2016-07-01 ENCOUNTER — Ambulatory Visit (INDEPENDENT_AMBULATORY_CARE_PROVIDER_SITE_OTHER): Payer: BLUE CROSS/BLUE SHIELD | Admitting: Endocrinology

## 2016-07-01 ENCOUNTER — Encounter: Payer: Self-pay | Admitting: Endocrinology

## 2016-07-01 VITALS — BP 142/92 | HR 90 | Ht 71.0 in | Wt 188.0 lb

## 2016-07-01 DIAGNOSIS — N183 Chronic kidney disease, stage 3 (moderate): Secondary | ICD-10-CM

## 2016-07-01 DIAGNOSIS — E1122 Type 2 diabetes mellitus with diabetic chronic kidney disease: Secondary | ICD-10-CM | POA: Diagnosis not present

## 2016-07-01 MED ORDER — SITAGLIPTIN PHOSPHATE 50 MG PO TABS: 50.0000 mg | ORAL_TABLET | Freq: Every day | ORAL | 11 refills | Status: DC

## 2016-07-01 MED ORDER — BAYER CONTOUR NEXT EZ W/DEVICE KIT: 1.0000 | PACK | Freq: Once | 0 refills | Status: AC

## 2016-07-01 MED ORDER — GLUCOSE BLOOD VI STRP: 1.0000 | ORAL_STRIP | Freq: Two times a day (BID) | 12 refills | Status: DC

## 2016-07-01 NOTE — Progress Notes (Signed)
Subjective:    Patient ID: Joe Wise, male    DOB: 1956-05-20, [age] y.o.   MRN: [id number]  HPI pt is referred by Dr Posey ReaPlotnikov, for diabetes.  Pt states DM was dx'ed in 2007; he has mild if any neuropathy of the lower extremities, but he has associated renal insufficiency; he has never been on insulin; pt says his diet and exercise are good; he has never had pancreatitis, severe hypoglycemia or DKA.  He takes 2 oral meds.  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   Past Medical History:  Diagnosis Date  . BPH (benign prostatic hyperplasia)   . Chronic kidney disease Nov '13   nephrolithiasis 4mm left - passed  . Diabetes mellitus   . H/O urinary retention Jan '14   secondary to decongestants - resolved.  . Hyperlipidemia   . Hypertension   . Other testicular hypofunction     Past Surgical History:  Procedure Laterality Date  . MOUTH SURGERY    . MOUTH SURGERY  2012   with removal of a hard palate boney deformith  . NASAL SINUS SURGERY      Social History   Social History  . Marital status: Divorced    Spouse name: N/A  . Number of children: 2  . Years of education: 14   Occupational History  . load operator    Social History Main Topics  . Smoking status: Never Smoker  . Smokeless tobacco: Current User     Comment: counselled  . Alcohol use 1.0 oz/week    2 drink(s) per week     Comment: couple of beers  . Drug use: No  . Sexual activity: Yes    Partners: Female   Other Topics Concern  . Not on file   Social History Narrative   HSG, [employer] -2 years. Married '83-seperated/divorced '06. 1 son '81; 1 daughter ''[id number]; 1 grandchild (girl) '06. 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    Current Outpatient Prescriptions on File Prior to Visit  Medication Sig Dispense Refill  . amitriptyline (ELAVIL) 25 MG tablet TAKE 1 TABLET BY MOUTH AT BEDTIME AS NEEDED FOR SLEEP 90 tablet 3  . dutasteride (AVODART) 0.5 MG capsule Take 1 capsule (0.5 mg  total) by mouth daily.    . finasteride (PROSCAR) 5 MG tablet Take 1 tablet by mouth daily.  3  . glimepiride (AMARYL) 4 MG tablet TAKE 1 TABLET BY MOUTH EVERY DAY BEFORE BREAKFAST 90 tablet 3  . halobetasol (ULTRAVATE) 0.05 % cream Apply topically 2 (two) times daily. 30 g 1  . lisinopril-hydrochlorothiazide (PRINZIDE,ZESTORETIC) 20-25 MG tablet TAKE 1 TABLET BY MOUTH EVERY DAY 90 tablet 3  . loratadine (CLARITIN) 10 MG tablet Take 1 tablet (10 mg total) by mouth daily. 100 tablet 3  . metFORMIN (GLUCOPHAGE) 1000 MG tablet Take 1 tablet (1,000 mg total) by mouth 2 (two) times daily with a meal. 180 tablet 3  . mometasone (NASONEX) 50 MCG/ACT nasal spray Place 2 sprays into the nose daily. 17 g 12  . simvastatin (ZOCOR) 20 MG tablet TAKE 1 TABLET BY MOUTH AT BEDTIME 90 tablet 3  . Testosterone (ANDROGEL PUMP TD) Place onto the skin. 1% gel; apply daily     No current facility-administered medications on file prior to visit.     Allergies  Allergen Reactions  . Sudafed [Pseudoephedrine]     Family History  Problem Relation Age of Onset  . Arthritis Mother   .  Alcohol abuse Brother   . Cancer Brother   . Alcohol abuse Brother   . Cancer Brother   . Hypertension Brother   . Diabetes Neg Hx   . Heart disease Neg Hx     BP (!) 142/92   Pulse 90   Ht 5\' 11"  (1.803 m)   Wt 188 lb (85.3 kg)   SpO2 97%   BMI 26.22 kg/m     Review of Systems denies weight loss, blurry vision, headache, chest pain, sob, n/v, muscle cramps, excessive diaphoresis, depression, cold intolerance, rhinorrhea, and easy bruising.  He has frequent urination.     Objective:   Physical Exam VS: see vs page GEN: no distress HEAD: head: no deformity eyes: no periorbital swelling, no proptosis external nose and ears are normal mouth: no lesion seen NECK: supple, thyroid is not enlarged CHEST WALL: no deformity LUNGS: clear to auscultation CV: reg rate and rhythm, no murmur ABD: abdomen is soft,  nontender.  no hepatosplenomegaly.  not distended.  no hernia MUSCULOSKELETAL: muscle bulk and strength are grossly normal.  no obvious joint swelling.  gait is normal and steady EXTEMITIES: no deformity.  no ulcer on the feet.  feet are of normal color and temp.  no edema.  There is bilateral onychomycosis of the toenails, and bilat calluses. PULSES: dorsalis pedis intact bilat.  no carotid bruit NEURO:  cn 2-12 grossly intact.   readily moves all 4's.  sensation is intact to touch on the feet.   SKIN:  Normal texture and temperature.  No rash or suspicious lesion is visible.   NODES:  None palpable at the neck.   PSYCH: alert, well-oriented.  Does not appear anxious nor depressed.     Lab Results  Component Value Date   HGBA1C 10.1 (H) 05/03/2016   Lab Results  Component Value Date   CREATININE 1.55 (H) 05/03/2016   BUN 19 05/03/2016   NA 139 05/03/2016   K 3.8 05/03/2016   CL 101 05/03/2016   CO2 31 05/03/2016    I personally reviewed electrocardiogram tracing (11/25/13): Indication: wellness. Impression: normal to my reading  I have reviewed outside records, and summarized: Pt was noted to have severely elevated a1c, and referred here.  He was seen for other probs such as HTN and hypogonadism, but these were stable.       Assessment & Plan:  Type 2 DM, with renal insufficiency: severe exacerbation.  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   Patient is advised the following: Patient Instructions  good diet and exercise significantly improve the control of your diabetes.  please let me know if you wish to be referred to a dietician.  high blood sugar is very risky to your health.  you should see an eye doctor and dentist every year.  It is very important to get all recommended vaccinations.  Controlling your blood pressure and cholesterol drastically reduces the damage diabetes does to your body.  Those who smoke should quit.  Please discuss these with your  doctor.  check your blood sugar twice a day.  vary the time of day when you check, between before the 3 meals, and at bedtime.  also check if you have symptoms of your blood sugar being too high or too low.  please keep a record of the readings and bring it to your next appointment here (or you can bring the meter itself).  You can write it on any piece of paper.  please call us sooner if your blood sugar goes below 70, or if you have a lot of readings over 200.   I have sent a prescription to your pharmacy, for a meter and for strips.   I have sent a prescription to your pharmacy, to take an additional diabetes pill.  This is unlikely to get your blood sugar to a safe range.  Also, we'll need to taper off your other 2 diabetes pills, because your kidneys are slightly off. Therefore, you should take insulin.  Please see Bonita Quin, to learn about taking insulin.  Please let us know if you decide to take it once a day, or 3 times a day. Please come back for a follow-up appointment in 1 month.

## 2016-07-01 NOTE — Patient Instructions (Addendum)
good diet and exercise significantly improve the control of your diabetes.  please let me know if you wish to be referred to a dietician.  high blood sugar is very risky to your health.  you should see an eye doctor and dentist every year.  It is very important to get all recommended vaccinations.  Controlling your blood pressure and cholesterol drastically reduces the damage diabetes does to your body.  Those who smoke should quit.  Please discuss these with your doctor.  check your blood sugar twice a day.  vary the time of day when you check, between before the 3 meals, and at bedtime.  also check if you have symptoms of your blood sugar being too high or too low.  please keep a record of the readings and bring it to your next appointment here (or you can bring the meter itself).  You can write it on any piece of paper.  please call us sooner if your blood sugar goes below 70, or if you have a lot of readings over 200.   I have sent a prescription to your pharmacy, for a meter and for strips.   I have sent a prescription to your pharmacy, to take an additional diabetes pill.  This is unlikely to get your blood sugar to a safe range.  Also, we'll need to taper off your other 2 diabetes pills, because your kidneys are slightly off. Therefore, you should take insulin.  Please see Bonita QuinLinda, to learn about taking insulin.  Please let us know if you decide to take it once a day, or 3 times a day. Please come back for a follow-up appointment in 1 month.

## 2016-07-04 ENCOUNTER — Other Ambulatory Visit: Payer: Self-pay

## 2016-07-04 MED ORDER — GLUCOSE BLOOD VI STRP: ORAL_STRIP | 5 refills | Status: AC

## 2016-07-04 MED ORDER — ONETOUCH VERIO IQ SYSTEM W/DEVICE KIT: PACK | 2 refills | Status: AC

## 2016-07-08 ENCOUNTER — Encounter: Payer: Self-pay | Admitting: Internal Medicine

## 2016-07-08 LAB — HM DIABETES EYE EXAM

## 2016-07-23 IMAGING — MR MR ABDOMEN W/O CM
4 of 8 series · 18 of 48 positions shown · non-contrast
Comparison: [facility] 11/26/2012, [REDACTED] CT
05/16/2012

CLINICAL DATA: Right adrenal mass on prior exam

EXAM:
MRI ABDOMEN WITHOUT CONTRAST
TECHNIQUE: Multiplanar multisequence MR imaging was performed without the
administration of intravenous contrast.

[Series 3: cor haste · coronal · 5.0mm · 0.66mm/px · 2 of 22 slices shown]
[im 1/22]
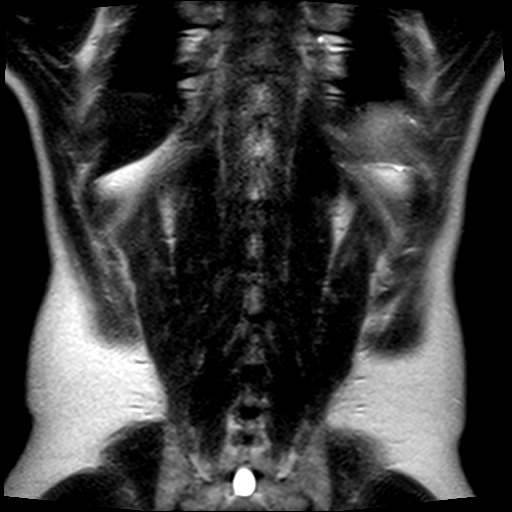
[im 22/22]
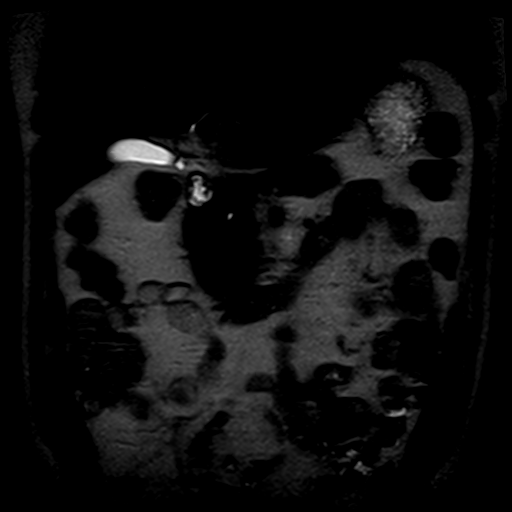

[Series 4: T1 · axial · 5.0mm · 0.68mm/px · z∈[-33,+122]mm · 8 of 64 slices shown (1 of 2)]
[im 1/64]
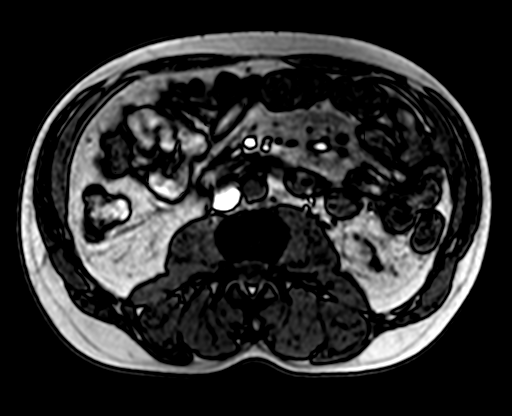
[im 10/64]
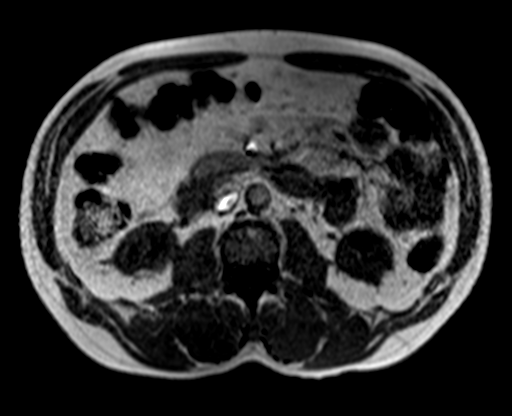
[im 19/64]
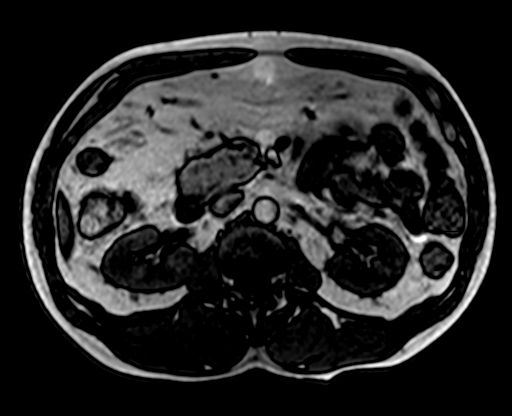
[im 28/64]
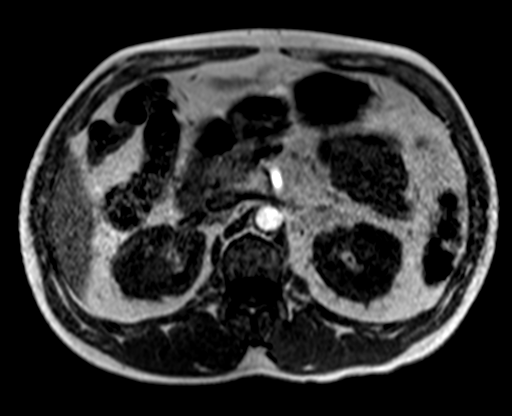
[im 37/64]
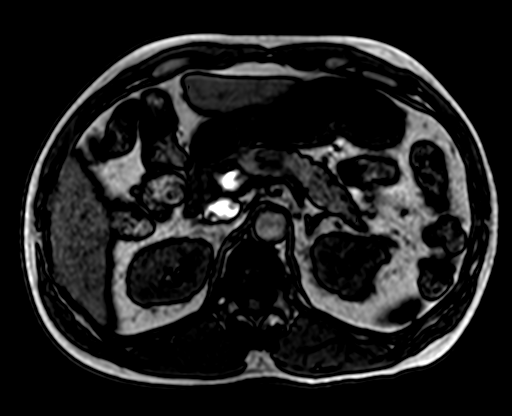
[im 46/64]
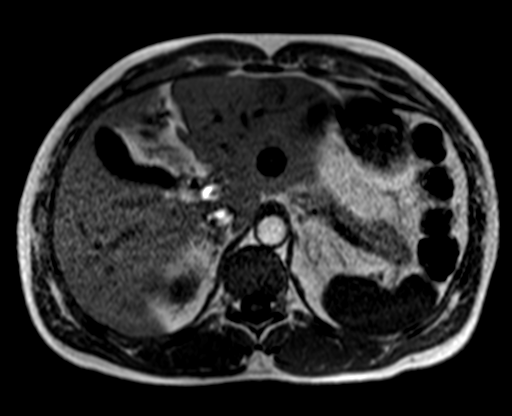
[im 55/64]
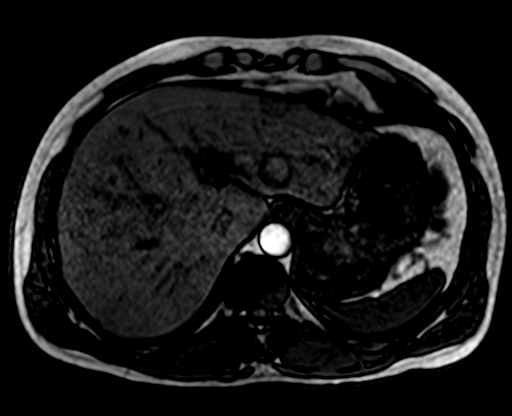
[im 64/64]
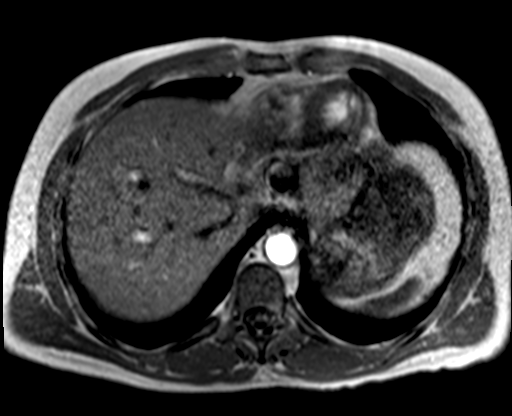

[Series 5: T1 · coronal · 4.0mm · 0.78mm/px · 5 of 46 slices shown (2 of 2)]
[im 1/46]
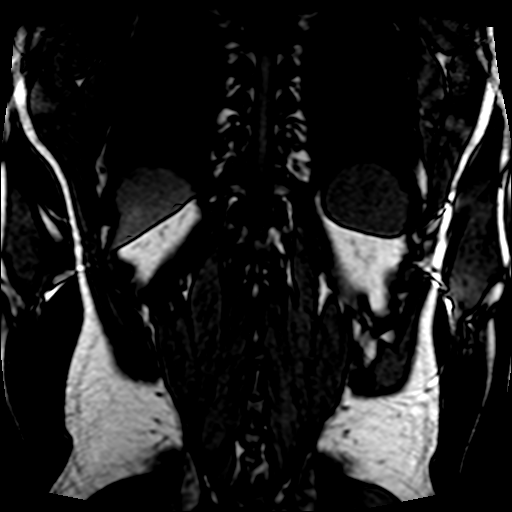
[im 12/46]
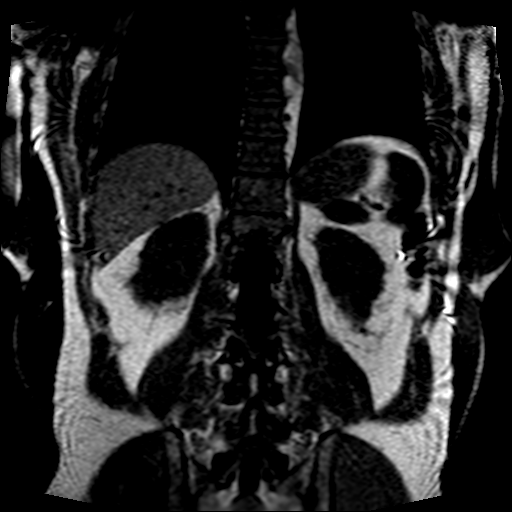
[im 23/46]
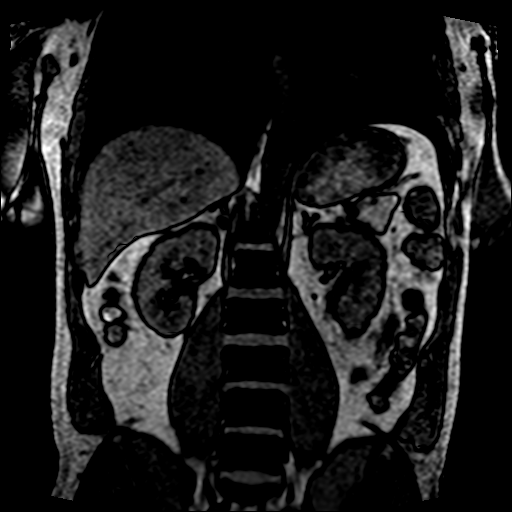
[im 34/46]
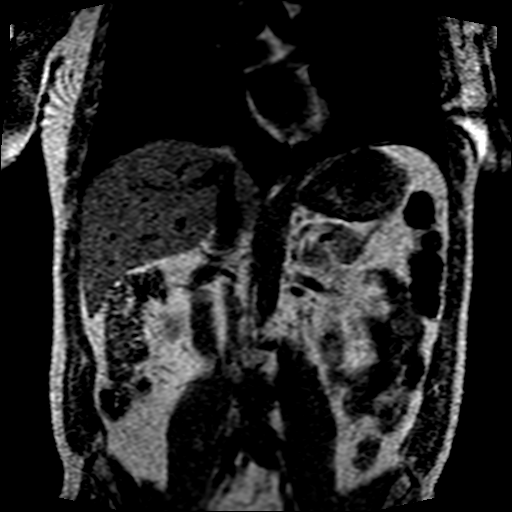
[im 46/46]
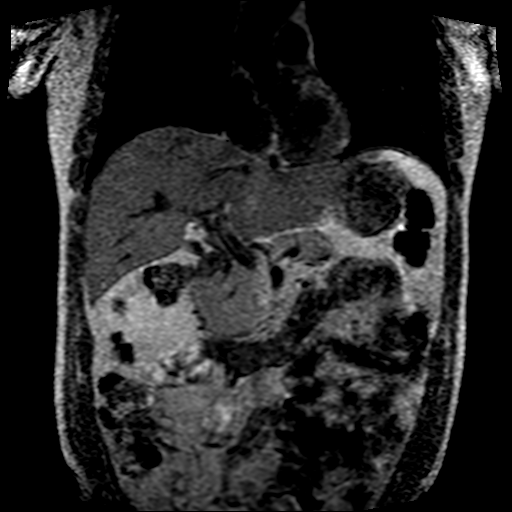

[Series 6: T2 · axial · 5.0mm · 0.49mm/px · z∈[-20,+135]mm · 3 of 32 slices shown]
[im 1/32]
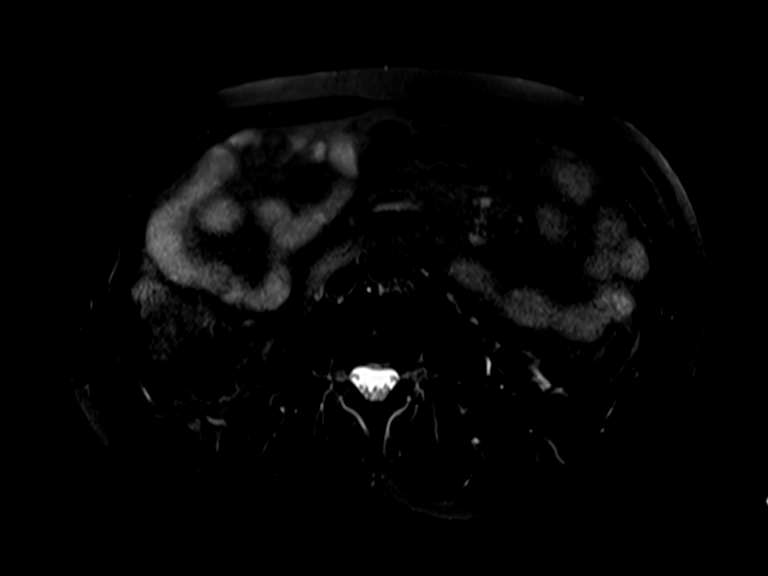
[im 21/32]
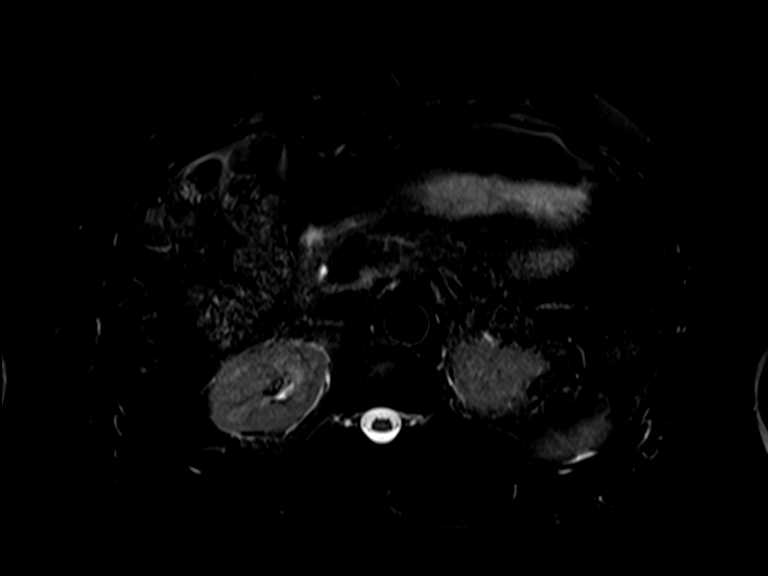
[im 32/32]
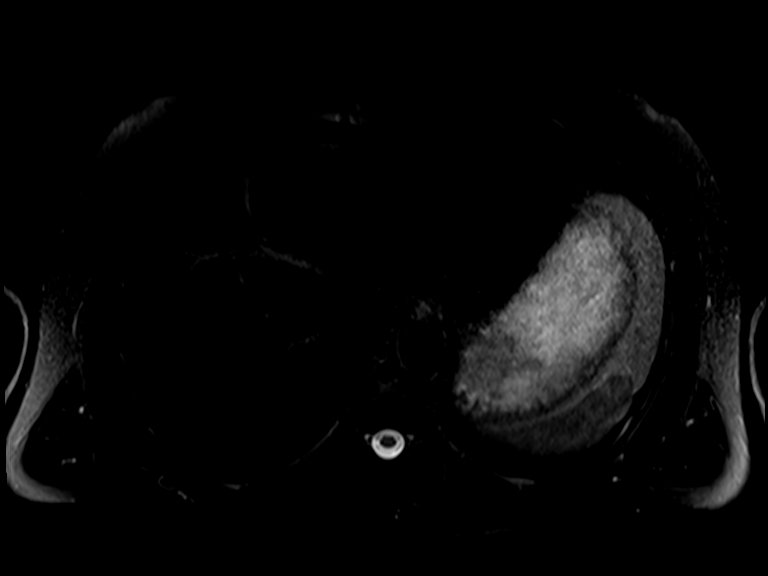

[18 of 48 positions shown; findings below may reference images not displayed]

FINDINGS: Examination performed without contrast due to renal failure. 1.5 cm
right adrenal mass is reidentified. T1 weighted signal intensity
measures 143. At of a signal intensity measures 127. Subjectively,
there is a focus of mild T1 hyperintensity within this mass image 44
in phase imaging which demonstrates subjective drop in signal image
12 series [DATE] of phase imaging, suggesting a focus of internal
fat, although this is too small to be quantitatively confirmed as
fat.

Visualized liver, pancreas, spleen, gallbladder, left adrenal gland,
and kidneys are unremarkable with the exception of sub cm right
renal cortical T2 hyperintense. No ascites or lymphadenopathy.
IMPRESSION: Right adrenal mass is without significant interval change since the
least recent available comparison exam 05/16/2012, compatible with a
lipid poor adenoma by typical imaging criteria. Although there is no
evidence for gross fat at out of phase imaging, there is a
subjective focus of signal intensity loss which may indicate
microscopic lipid. Due to long-term stability, and imaging features
suggesting benignity, no further imaging followup is likely
necessary for this benign appearing finding.

## 2016-08-01 ENCOUNTER — Encounter: Payer: BLUE CROSS/BLUE SHIELD | Attending: Endocrinology | Admitting: Nutrition

## 2016-08-01 ENCOUNTER — Encounter: Payer: Self-pay | Admitting: Endocrinology

## 2016-08-01 ENCOUNTER — Ambulatory Visit (INDEPENDENT_AMBULATORY_CARE_PROVIDER_SITE_OTHER): Payer: BLUE CROSS/BLUE SHIELD | Admitting: Endocrinology

## 2016-08-01 VITALS — BP 136/86 | HR 83 | Ht 71.0 in | Wt 184.0 lb

## 2016-08-01 DIAGNOSIS — Z713 Dietary counseling and surveillance: Secondary | ICD-10-CM | POA: Diagnosis present

## 2016-08-01 DIAGNOSIS — N183 Chronic kidney disease, stage 3 unspecified: Secondary | ICD-10-CM

## 2016-08-01 DIAGNOSIS — E1121 Type 2 diabetes mellitus with diabetic nephropathy: Secondary | ICD-10-CM

## 2016-08-01 DIAGNOSIS — E1122 Type 2 diabetes mellitus with diabetic chronic kidney disease: Secondary | ICD-10-CM

## 2016-08-01 LAB — POCT GLYCOSYLATED HEMOGLOBIN (HGB A1C): Hemoglobin A1C: 9.9

## 2016-08-01 MED ORDER — BASAGLAR KWIKPEN 100 UNIT/ML ~~LOC~~ SOPN: 10.0000 [IU] | PEN_INJECTOR | SUBCUTANEOUS | 11 refills | Status: DC

## 2016-08-01 NOTE — Patient Instructions (Addendum)
check your blood sugar twice a day.  vary the time of day when you check, between before the 3 meals, and at bedtime.  also check if you have symptoms of your blood sugar being too high or too low.  please keep a record of the readings and bring it to your next appointment here (or you can bring the meter itself).  You can write it on any piece of paper.  please call us sooner if your blood sugar goes below 70, or if you have a lot of readings over 200.  Please see Bonita QuinLinda, to learn about taking insulin.  If you agree, I'll send a prescription to start 10 units each morning.  Please come back for a follow-up appointment in 2 weeks.

## 2016-08-01 NOTE — Progress Notes (Signed)
Subjective:    Patient ID: Joe Wise, male    DOB: [birth year], 61 y.o.   MRN: [id number]  HPI Pt returns for f/u of diabetes mellitus: DM type: 2 Dx'ed: 0947 Complications: renal insufficiency Therapy: 3 oral meds DKA: never Severe hypoglycemia: never Pancreatitis: never.  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   Interval history: Meter is downloaded today, and the printout is scanned into the record.  All are in the low-200's.  There is no trend throughout the day.   Past Medical History:  Diagnosis Date  . BPH (benign prostatic hyperplasia)   . Chronic kidney disease Nov '13   nephrolithiasis 71m left - passed  . Diabetes mellitus   . H/O urinary retention Jan '14   secondary to decongestants - resolved.  . Hyperlipidemia   . Hypertension   . Other testicular hypofunction     Past Surgical History:  Procedure Laterality Date  . MOUTH SURGERY    . MOUTH SURGERY  2012   with removal of a hard palate boney deformith  . NASAL SINUS SURGERY      Social History   Social History  . Marital status: Divorced    Spouse name: N/A  . Number of children: 2  . Years of education: 14   Occupational History  . load operator    Social History Main Topics  . Smoking status: Never Smoker  . Smokeless tobacco: Current User     Comment: counselled  . Alcohol use 1.0 oz/week    2 drink(s) per week     Comment: couple of beers  . Drug use: No  . Sexual activity: Yes    Partners: Female   Other Topics Concern  . Not on file   Social History Narrative   HS[employer], [employer]-2 years. Married '83-seperated/divorced '06. 1 son '81; 1 daughter ''86; 1 grandchild (girl) '06. 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    Current Outpatient Prescriptions on File Prior to Visit  Medication Sig Dispense Refill  . amitriptyline (ELAVIL) 25 MG tablet TAKE 1 TABLET BY MOUTH AT BEDTIME AS NEEDED FOR SLEEP 90 tablet 3  . Blood Glucose  Monitoring Suppl (ONETOUCH VERIO IQ SYSTEM) w/Device KIT Use to check blood sugar two times per day. 1 kit 2  . dutasteride (AVODART) 0.5 MG capsule Take 1 capsule (0.5 mg total) by mouth daily.    . finasteride (PROSCAR) 5 MG tablet Take 1 tablet by mouth daily.  3  . glimepiride (AMARYL) 4 MG tablet TAKE 1 TABLET BY MOUTH EVERY DAY BEFORE BREAKFAST 90 tablet 3  . glucose blood (ONETOUCH VERIO) test strip Use to check blood sugar two times per day. 100 each 5  . halobetasol (ULTRAVATE) 0.05 % cream Apply topically 2 (two) times daily. 30 g 1  . lisinopril-hydrochlorothiazide (PRINZIDE,ZESTORETIC) 20-25 MG tablet TAKE 1 TABLET BY MOUTH EVERY DAY 90 tablet 3  . loratadine (CLARITIN) 10 MG tablet Take 1 tablet (10 mg total) by mouth daily. 100 tablet 3  . metFORMIN (GLUCOPHAGE) 1000 MG tablet Take 1 tablet (1,000 mg total) by mouth 2 (two) times daily with a meal. 180 tablet 3  . mometasone (NASONEX) 50 MCG/ACT nasal spray Place 2 sprays into the nose daily. 17 g 12  . simvastatin (ZOCOR) 20 MG tablet TAKE 1 TABLET BY MOUTH AT BEDTIME 90 tablet 3  . sitaGLIPtin (JANUVIA) 50 MG tablet Take 1 tablet (50 mg total) by  mouth daily. 30 tablet 11  . Testosterone (ANDROGEL PUMP TD) Place onto the skin. 1% gel; apply daily     No current facility-administered medications on file prior to visit.     Allergies  Allergen Reactions  . Sudafed [Pseudoephedrine]     Family History  Problem Relation Age of Onset  . Arthritis Mother   . Alcohol abuse Brother   . Cancer Brother   . Alcohol abuse Brother   . Cancer Brother   . Hypertension Brother   . Diabetes Neg Hx   . Heart disease Neg Hx     BP 136/86   Pulse 83   Ht _0  (1.803 m)   Wt 184 lb (83.5 kg)   SpO2 97%   BMI 25.66 kg/m    Review of Systems He denies hypoglycemia.      Objective:   Physical Exam VITAL SIGNS:  See vs page GENERAL: no distress Pulses: dorsalis pedis intact bilat.   MSK: no deformity of the feet CV: no  leg edema Skin:  no ulcer on the feet.  normal color and temp on the feet. Neuro: sensation is intact to touch on the feet.    A1c=9.9%    Assessment & Plan:  Insulin-requiring type 2 DM, with renal insufficiency: As he is hesitant to take insulin, we'll start with QD.    Patient is advised the following: Patient Instructions  check your blood sugar twice a day.  vary the time of day when you check, between before the 3 meals, and at bedtime.  also check if you have symptoms of your blood sugar being too high or too low.  please keep a record of the readings and bring it to your next appointment here (or you can bring the meter itself).  You can write it on any piece of paper.  please call us sooner if your blood sugar goes below 70, or if you have a lot of readings over 200.  Please see Vaughan Basta, to learn about taking insulin.  If you agree, I'll send a prescription to start 10 units each morning.  Please come back for a follow-up appointment in 2 weeks.

## 2016-08-02 NOTE — Progress Notes (Signed)
Joe Wise was instructed on why he needs insulin, how this insulin works and how to use the Lantus pen.  He redemonstrated the procedure of attaching a needle, and dialing in 10 units.  He reverbalized the correct dose of 10u every morning.  We discussed where/and the need to rotate injection sites and he reported good understanding. We also discussed low blood sugars--symptoms and treatment.  He reported good understanding of this, and had no final questions.  We also discussed the need to test blood sugars in the AM, to determine if the dose of insulin is working, and he agreed to do this.  He was given a brochure on Lantus insulin, and one on pen use.

## 2016-08-02 NOTE — Patient Instructions (Signed)
Inject 10 units of Lantus insulin daily  Test blood sugars once a day.

## 2016-08-17 ENCOUNTER — Ambulatory Visit (INDEPENDENT_AMBULATORY_CARE_PROVIDER_SITE_OTHER): Payer: BLUE CROSS/BLUE SHIELD | Admitting: Endocrinology

## 2016-08-17 ENCOUNTER — Encounter: Payer: Self-pay | Admitting: Endocrinology

## 2016-08-17 VITALS — BP 122/84 | HR 77 | Ht 71.0 in | Wt 183.0 lb

## 2016-08-17 DIAGNOSIS — E1121 Type 2 diabetes mellitus with diabetic nephropathy: Secondary | ICD-10-CM

## 2016-08-17 MED ORDER — BASAGLAR KWIKPEN 100 UNIT/ML ~~LOC~~ SOPN: 20.0000 [IU] | PEN_INJECTOR | SUBCUTANEOUS | 11 refills | Status: DC

## 2016-08-17 NOTE — Patient Instructions (Addendum)
check your blood sugar twice a day.  vary the time of day when you check, between before the 3 meals, and at bedtime.  also check if you have symptoms of your blood sugar being too high or too low.  please keep a record of the readings and bring it to your next appointment here (or you can bring the meter itself).  You can write it on any piece of paper.  please call us sooner if your blood sugar goes below 70, or if you have a lot of readings over 200.  For now, please: Stop taking the glimepiride, metformin, and januvia, and: Increase the insulin to 20 units each morning.  Please come back for a follow-up appointment in 1 month.

## 2016-08-17 NOTE — Progress Notes (Signed)
Subjective:    Patient ID: Joe Wise, male    DOB: [birth year], 61 y.o.   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   Past Medical History:  Diagnosis Date  . BPH (benign prostatic hyperplasia)   . Chronic kidney disease Nov '13   nephrolithiasis 78m left - passed  . Diabetes mellitus   . H/O urinary retention Jan '14   secondary to decongestants - resolved.  . Hyperlipidemia   . Hypertension   . Other testicular hypofunction     Past Surgical History:  Procedure Laterality Date  . MOUTH SURGERY    . MOUTH SURGERY  2012   with removal of a hard palate boney deformith  . NASAL SINUS SURGERY      Social History   Social History  . Marital status: Divorced    Spouse name: N/A  . Number of children: 2  . Years of education: 14   Occupational History  . load operator    Social History Main Topics  . Smoking status: Never Smoker  . Smokeless tobacco: Current User     Comment: counselled  . Alcohol use 1.0 oz/week    2 drink(s) per week     Comment: couple of beers  . Drug use: No  . Sexual activity: Yes    Partners: Female   Other Topics Concern  . Not on file   Social History Narrative   HSG, [employer]-2 years. Married '83-seperated/divorced '06. 1 son '81; 1 daughter ''86; 1 grandchild (girl) '06. 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    Current Outpatient Prescriptions on File Prior to Visit  Medication Sig Dispense Refill  . amitriptyline (ELAVIL) 25 MG tablet TAKE 1 TABLET BY MOUTH AT BEDTIME AS NEEDED FOR  SLEEP 90 tablet 3  . Blood Glucose Monitoring Suppl (ONETOUCH VERIO IQ SYSTEM) w/Device KIT Use to check blood sugar two times per day. 1 kit 2  . dutasteride (AVODART) 0.5 MG capsule Take 1 capsule (0.5 mg total) by mouth daily.    . finasteride (PROSCAR) 5 MG tablet Take 1 tablet by mouth daily.  3  . glucose blood (ONETOUCH VERIO) test strip Use to check blood sugar two times per day. 100 each 5  . halobetasol (ULTRAVATE) 0.05 % cream Apply topically 2 (two) times daily. 30 g 1  . lisinopril-hydrochlorothiazide (PRINZIDE,ZESTORETIC) 20-25 MG tablet TAKE 1 TABLET BY MOUTH EVERY DAY 90 tablet 3  . loratadine (CLARITIN) 10 MG tablet Take 1 tablet (10 mg total) by mouth daily. 100 tablet 3  . mometasone (NASONEX) 50 MCG/ACT nasal spray Place 2 sprays into the nose daily. 17 g 12  . simvastatin (ZOCOR) 20 MG tablet TAKE 1 TABLET BY MOUTH AT BEDTIME 90 tablet 3  . Testosterone (ANDROGEL PUMP TD) Place onto the skin. 1% gel; apply daily     No current facility-administered medications on file prior to visit.     Allergies  Allergen Reactions  . Sudafed [Pseudoephedrine]     Family History  Problem Relation Age of Onset  . Arthritis  Mother   . Alcohol abuse Brother   . Cancer Brother   . Alcohol abuse Brother   . Cancer Brother   . Hypertension Brother   . Diabetes Neg Hx   . Heart disease Neg Hx     BP 122/84   Pulse 77   Ht '5\' 11"'$  (1.803 m)   Wt 183 lb (83 kg)   SpO2 97%   BMI 25.52 kg/m    Review of Systems He denies hypoglycemia    Objective:   Physical Exam VITAL SIGNS:  See vs page GENERAL: no distress Pulses: dorsalis pedis intact bilat.   MSK: no deformity of the feet CV: no leg edema Skin:  no ulcer on the feet.  normal color and temp on the feet. Neuro: sensation is intact to touch on the feet      Assessment & Plan:  Insulin-requiring type 2 DM, with renal insufficiency: he needs increased rx.   Patient is advised the following: Patient Instructions    check your blood sugar twice a day.  vary the time of day when you check, between before the 3 meals, and at bedtime.  also check if you have symptoms of your blood sugar being too high or too low.  please keep a record of the readings and bring it to your next appointment here (or you can bring the meter itself).  You can write it on any piece of paper.  please call us sooner if your blood sugar goes below 70, or if you have a lot of readings over 200.  For now, please: Stop taking the glimepiride, metformin, and januvia, and: Increase the insulin to 20 units each morning.  Please come back for a follow-up appointment in 1 month.

## 2016-08-29 ENCOUNTER — Other Ambulatory Visit: Payer: Self-pay

## 2016-08-29 MED ORDER — MICROLET LANCETS MISC: 2 refills | Status: AC

## 2016-08-31 ENCOUNTER — Ambulatory Visit: Payer: BLUE CROSS/BLUE SHIELD | Admitting: Internal Medicine

## 2016-09-09 ENCOUNTER — Other Ambulatory Visit (INDEPENDENT_AMBULATORY_CARE_PROVIDER_SITE_OTHER): Payer: BLUE CROSS/BLUE SHIELD

## 2016-09-09 ENCOUNTER — Encounter: Payer: Self-pay | Admitting: Internal Medicine

## 2016-09-09 ENCOUNTER — Ambulatory Visit (INDEPENDENT_AMBULATORY_CARE_PROVIDER_SITE_OTHER): Payer: BLUE CROSS/BLUE SHIELD | Admitting: Internal Medicine

## 2016-09-09 VITALS — BP 152/98 | HR 81 | Temp 98.1°F | Resp 16 | Ht 71.0 in | Wt 183.2 lb

## 2016-09-09 DIAGNOSIS — I10 Essential (primary) hypertension: Secondary | ICD-10-CM

## 2016-09-09 DIAGNOSIS — E278 Other specified disorders of adrenal gland: Secondary | ICD-10-CM

## 2016-09-09 DIAGNOSIS — E119 Type 2 diabetes mellitus without complications: Secondary | ICD-10-CM

## 2016-09-09 DIAGNOSIS — E785 Hyperlipidemia, unspecified: Secondary | ICD-10-CM | POA: Diagnosis not present

## 2016-09-09 DIAGNOSIS — Z0000 Encounter for general adult medical examination without abnormal findings: Secondary | ICD-10-CM

## 2016-09-09 DIAGNOSIS — E279 Disorder of adrenal gland, unspecified: Secondary | ICD-10-CM | POA: Diagnosis not present

## 2016-09-09 DIAGNOSIS — E1121 Type 2 diabetes mellitus with diabetic nephropathy: Secondary | ICD-10-CM

## 2016-09-09 DIAGNOSIS — J309 Allergic rhinitis, unspecified: Secondary | ICD-10-CM

## 2016-09-09 LAB — BASIC METABOLIC PANEL
BUN: 25 mg/dL — AB (ref 6–23)
CALCIUM: 9.7 mg/dL (ref 8.4–10.5)
CHLORIDE: 97 meq/L (ref 96–112)
CO2: 31 mEq/L (ref 19–32)
CREATININE: 1.61 mg/dL — AB (ref 0.40–1.50)
GFR: 56.27 mL/min — ABNORMAL LOW (ref >60.00)
Glucose, Bld: 308 mg/dL — ABNORMAL HIGH (ref 70–99)
Potassium: 4 mEq/L (ref 3.5–5.1)
Sodium: 134 mEq/L — ABNORMAL LOW (ref 135–145)

## 2016-09-09 LAB — LIPID PANEL
Cholesterol: 160 mg/dL (ref 0–200)
HDL: 49.2 mg/dL (ref >39.00)
LDL Cholesterol: 87 mg/dL (ref 0–99)
NONHDL: 110.54
Total CHOL/HDL Ratio: 3
Triglycerides: 116 mg/dL (ref 0.0–149.0)
VLDL: 23.2 mg/dL (ref 0.0–40.0)

## 2016-09-09 LAB — HEPATIC FUNCTION PANEL
ALT: 19 U/L (ref 0–53)
AST: 15 U/L (ref 0–37)
Albumin: 4.4 g/dL (ref 3.5–5.2)
Alkaline Phosphatase: 50 U/L (ref 39–117)
BILIRUBIN DIRECT: 0.1 mg/dL (ref 0.0–0.3)
BILIRUBIN TOTAL: 0.6 mg/dL (ref 0.2–1.2)
Total Protein: 6.9 g/dL (ref 6.0–8.3)

## 2016-09-09 LAB — HEMOGLOBIN A1C: HEMOGLOBIN A1C: 10.5 % — AB (ref 4.6–6.5)

## 2016-09-09 NOTE — Assessment & Plan Note (Signed)
q 6 mo check

## 2016-09-09 NOTE — Assessment & Plan Note (Signed)
-   Zocor 

## 2016-09-09 NOTE — Assessment & Plan Note (Addendum)
Prinizide BP is good at home Take Prinizide now

## 2016-09-09 NOTE — Progress Notes (Signed)
Pre-visit discussion using our clinic review tool. No additional management support is needed unless otherwise documented below in the visit note.  

## 2016-09-09 NOTE — Assessment & Plan Note (Signed)
Nasonex, Claritin

## 2016-09-09 NOTE — Progress Notes (Signed)
Subjective:  Patient ID: Joe Wise, male    DOB: 07-May-1956  Age: 61 y.o. MRN: [id number]  CC: Follow-up (HTn, DM 2)   HPI Joe Wise presents for DM, HTN, allergies f/u  Outpatient Medications Prior to Visit  Medication Sig Dispense Refill  . amitriptyline (ELAVIL) 25 MG tablet TAKE 1 TABLET BY MOUTH AT BEDTIME AS NEEDED FOR SLEEP 90 tablet 3  . Blood Glucose Monitoring Suppl (ONETOUCH VERIO IQ SYSTEM) w/Device KIT Use to check blood sugar two times per day. 1 kit 2  . dutasteride (AVODART) 0.5 MG capsule Take 1 capsule (0.5 mg total) by mouth daily.    . finasteride (PROSCAR) 5 MG tablet Take 1 tablet by mouth daily.  3  . glucose blood (ONETOUCH VERIO) test strip Use to check blood sugar two times per day. 100 each 5  . halobetasol (ULTRAVATE) 0.05 % cream Apply topically 2 (two) times daily. 30 g 1  . Insulin Glargine (BASAGLAR KWIKPEN) 100 UNIT/ML SOPN Inject 0.2 mLs (20 Units total) into the skin every morning. And pen needles 1/day 5 pen 11  . lisinopril-hydrochlorothiazide (PRINZIDE,ZESTORETIC) 20-25 MG tablet TAKE 1 TABLET BY MOUTH EVERY DAY 90 tablet 3  . loratadine (CLARITIN) 10 MG tablet Take 1 tablet (10 mg total) by mouth daily. 100 tablet 3  . MICROLET LANCETS MISC Use to check blood sugar 2 times per day. Dx code: E11.9 150 each 2  . mometasone (NASONEX) 50 MCG/ACT nasal spray Place 2 sprays into the nose daily. 17 g 12  . simvastatin (ZOCOR) 20 MG tablet TAKE 1 TABLET BY MOUTH AT BEDTIME 90 tablet 3  . Testosterone (ANDROGEL PUMP TD) Place onto the skin. 1% gel; apply daily     No facility-administered medications prior to visit.     ROS Review of Systems  Constitutional: Negative for appetite change, fatigue and unexpected weight change.  HENT: Negative for congestion, nosebleeds, sneezing, sore throat and trouble swallowing.   Eyes: Negative for itching and visual disturbance.  Respiratory: Negative for cough.   Cardiovascular: Negative for chest  pain, palpitations and leg swelling.  Gastrointestinal: Negative for abdominal distention, blood in stool, diarrhea and nausea.  Genitourinary: Negative for frequency and hematuria.  Musculoskeletal: Negative for back pain, gait problem, joint swelling and neck pain.  Skin: Negative for rash.  Neurological: Negative for dizziness, tremors, speech difficulty and weakness.  Psychiatric/Behavioral: Negative for agitation, dysphoric mood and sleep disturbance. The patient is not nervous/anxious.     Objective:  BP (!) 152/98   Pulse 81   Temp 98.1 F (36.7 C) (Oral)   Resp 16   Ht _0  (1.803 m)   Wt 183 lb 4 oz (83.1 kg)   SpO2 98%   BMI 25.56 kg/m   BP Readings from Last 3 Encounters:  09/09/16 (!) 152/98  08/17/16 122/84  08/01/16 136/86    Wt Readings from Last 3 Encounters:  09/09/16 183 lb 4 oz (83.1 kg)  08/17/16 183 lb (83 kg)  08/01/16 184 lb (83.5 kg)    Physical Exam  Constitutional: He is oriented to person, place, and time. He appears well-developed. No distress.  NAD  HENT:  Mouth/Throat: Oropharynx is clear and moist.  Eyes: Conjunctivae are normal. Pupils are equal, round, and reactive to light.  Neck: Normal range of motion. No JVD present. No thyromegaly present.  Cardiovascular: Normal rate, regular rhythm, normal heart sounds and intact distal pulses.  Exam reveals no gallop and no friction rub.  No murmur heard. Pulmonary/Chest: Effort normal and breath sounds normal. No respiratory distress. He has no wheezes. He has no rales. He exhibits no tenderness.  Abdominal: Soft. Bowel sounds are normal. He exhibits no distension and no mass. There is no tenderness. There is no rebound and no guarding.  Musculoskeletal: Normal range of motion. He exhibits no edema or tenderness.  Lymphadenopathy:    He has no cervical adenopathy.  Neurological: He is alert and oriented to person, place, and time. He has normal reflexes. No cranial nerve deficit. He exhibits  normal muscle tone. He displays a negative Romberg sign. Coordination and gait normal.  Skin: Skin is warm and dry. No rash noted.  Psychiatric: He has a normal mood and affect. His behavior is normal. Judgment and thought content normal.    Lab Results  Component Value Date   WBC 7.1 05/16/2012   HGB 13.1 05/16/2012   HCT 38.3 (L) 05/16/2012   PLT 207 05/16/2012   GLUCOSE 233 (H) 05/03/2016   CHOL 164 05/03/2016   TRIG 111.0 05/03/2016   HDL 48.60 05/03/2016   LDLCALC 93 05/03/2016   ALT 30 05/03/2016   AST 18 05/03/2016   NA 139 05/03/2016   K 3.8 05/03/2016   CL 101 05/03/2016   CREATININE 1.55 (H) 05/03/2016   BUN 19 05/03/2016   CO2 31 05/03/2016   TSH 1.41 09/03/2010   PSA 6.12 (H) 09/03/2010   HGBA1C 9.9 08/01/2016   MICROALBUR 3.3 (H) 11/22/2012    Mr Abdomen Wo Contrast  Result Date: 09/11/2014 CLINICAL DATA:  Right adrenal mass on prior exam EXAM: MRI ABDOMEN WITHOUT CONTRAST TECHNIQUE: Multiplanar multisequence MR imaging was performed without the administration of intravenous contrast. COMPARISON:  [place] CT 11/26/2012, [facility] CT 05/16/2012 FINDINGS: Examination performed without contrast due to renal failure. 1.5 cm right adrenal mass is reidentified. T1 weighted signal intensity measures 143. At of a signal intensity measures 127. Subjectively, there is a focus of mild T1 hyperintensity within this mass image 44 in phase imaging which demonstrates subjective drop in signal image 12 series 4 out of phase imaging, suggesting a focus of internal fat, although this is too small to be quantitatively confirmed as fat. Visualized liver, pancreas, spleen, gallbladder, left adrenal gland, and kidneys are unremarkable with the exception of sub cm right renal cortical T2 hyperintense. No ascites or lymphadenopathy. 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 Although there is no evidence for gross fat at out of phase imaging, there is a subjective focus of signal intensity loss which may indicate microscopic lipid. Due to long-term stability, and imaging features suggesting benignity, no further imaging followup is likely necessary for this benign appearing finding. Electronically Signed   By: Conchita Paris M.D.   On: 09/11/2014 08:41    Assessment & Plan:   There are no diagnoses linked to this encounter. I am having Mr. Kreiser maintain his Testosterone (ANDROGEL PUMP TD), dutasteride, finasteride, loratadine, mometasone, halobetasol, amitriptyline, simvastatin, lisinopril-hydrochlorothiazide, glucose blood, ONETOUCH VERIO IQ SYSTEM, BASAGLAR KWIKPEN, and MICROLET LANCETS.  No orders of the defined types were placed in this encounter.    Follow-up: No Follow-up on file.  Walker Kehr, MD

## 2016-09-09 NOTE — Assessment & Plan Note (Signed)
Pt is off metformin, sulfonylurea (Amaryl) 2018 On Basiglar 20 u/d

## 2016-09-15 ENCOUNTER — Encounter: Payer: Self-pay | Admitting: Endocrinology

## 2016-09-15 ENCOUNTER — Ambulatory Visit (INDEPENDENT_AMBULATORY_CARE_PROVIDER_SITE_OTHER): Payer: BLUE CROSS/BLUE SHIELD | Admitting: Endocrinology

## 2016-09-15 VITALS — BP 122/78 | HR 78 | Ht 71.0 in | Wt 185.0 lb

## 2016-09-15 DIAGNOSIS — E1121 Type 2 diabetes mellitus with diabetic nephropathy: Secondary | ICD-10-CM

## 2016-09-15 DIAGNOSIS — Z794 Long term (current) use of insulin: Secondary | ICD-10-CM

## 2016-09-15 MED ORDER — BASAGLAR KWIKPEN 100 UNIT/ML ~~LOC~~ SOPN: 40.0000 [IU] | PEN_INJECTOR | SUBCUTANEOUS | 11 refills | Status: DC

## 2016-09-15 NOTE — Progress Notes (Signed)
Subjective:    Patient ID: Joe Wise, male    DOB: 1955/10/29, 61 y.o.   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  Complications: renal insufficiency.  Therapy: insulin since 2018 DKA: never.  Severe hypoglycemia: never.  Pancreatitis: never.  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  Interval history: he says cbg's increased to 300 when he stopped [place], so he resumed.  He takes 20 units qd, as rx'ed.  Past Medical History:  Diagnosis Date  . BPH (benign prostatic hyperplasia)   . Chronic kidney disease Nov '13   nephrolithiasis 77m left - passed  . Diabetes mellitus   . H/O urinary retention Jan '14   secondary to decongestants - resolved.  . Hyperlipidemia   . Hypertension   . Other testicular hypofunction     Past Surgical History:  Procedure Laterality Date  . MOUTH SURGERY    . MOUTH SURGERY  2012   with removal of a hard palate boney deformith  . NASAL SINUS SURGERY      Social History   Social History  . Marital status: Divorced    Spouse name: N/A  . Number of children: 2  . Years of education: 14   Occupational History  . load operator    Social History Main Topics  . Smoking status: Never Smoker  . Smokeless tobacco: Current User    Types: Snuff     Comment: counselled  . Alcohol use 1.0 oz/week    2 Standard drinks or equivalent per week     Comment: couple of beers  . Drug use: No  . Sexual activity: Yes    Partners: Female   Other Topics Concern  . Not on file   Social History Narrative   HSG, [place]-2 years. Married '83-seperated/divorced '06. 1 son '81; 1 daughter ''86; 1 grandchild (girl) '06. 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    Current Outpatient Prescriptions on File Prior to Visit  Medication Sig Dispense  Refill  . amitriptyline (ELAVIL) 25 MG tablet TAKE 1 TABLET BY MOUTH AT BEDTIME AS NEEDED FOR SLEEP 90 tablet 3  . Blood Glucose Monitoring Suppl (ONETOUCH VERIO IQ SYSTEM) w/Device KIT Use to check blood sugar two times per day. 1 kit 2  . dutasteride (AVODART) 0.5 MG capsule Take 1 capsule (0.5 mg total) by mouth daily.    . finasteride (PROSCAR) 5 MG tablet Take 1 tablet by mouth daily.  3  . glucose blood (ONETOUCH VERIO) test strip Use to check blood sugar two times per day. 100 each 5  . halobetasol (ULTRAVATE) 0.05 % cream Apply topically 2 (two) times daily. 30 g 1  . lisinopril-hydrochlorothiazide (PRINZIDE,ZESTORETIC) 20-25 MG tablet TAKE 1 TABLET BY MOUTH EVERY DAY 90 tablet 3  . loratadine (CLARITIN) 10 MG tablet Take 1 tablet (10 mg total) by mouth daily. 100 tablet 3  . MICROLET LANCETS MISC Use to check blood sugar 2 times per day. Dx code: E11.9 150 each 2  . mometasone (NASONEX) 50 MCG/ACT nasal spray Place 2 sprays into the nose daily. 17 g 12  . simvastatin (ZOCOR) 20 MG tablet TAKE 1 TABLET BY MOUTH AT BEDTIME 90 tablet 3  . Testosterone (ANDROGEL PUMP TD) Place onto the skin. 1% gel;  apply daily     No current facility-administered medications on file prior to visit.     Allergies  Allergen Reactions  . Sudafed [Pseudoephedrine]     Family History  Problem Relation Age of Onset  . Arthritis Mother   . Alcohol abuse Brother   . Cancer Brother   . Alcohol abuse Brother   . Cancer Brother   . Hypertension Brother   . Diabetes Neg Hx   . Heart disease Neg Hx     BP 122/78   Pulse 78   Ht '5\' 11"'$  (1.803 m)   Wt 185 lb (83.9 kg)   SpO2 98%   BMI 25.80 kg/m    Review of Systems He denies hypoglycemia.     Objective:   Physical Exam VITAL SIGNS:  See vs page GENERAL: no distress Pulses: dorsalis pedis intact bilat.   MSK: no deformity of the feet CV: no leg edema Skin:  no ulcer on the feet.  normal color and temp on the feet. Neuro: sensation is  intact to touch on the feet   Lab Results  Component Value Date   HGBA1C 10.5 (H) 09/09/2016      Assessment & Plan:  Insulin-requiring type 2 DM, with renal insuff: he needs increased rx.    Patient is advised the following: Patient Instructions  check your blood sugar twice a day.  vary the time of day when you check, between before the 3 meals, and at bedtime.  also check if you have symptoms of your blood sugar being too high or too low.  please keep a record of the readings and bring it to your next appointment here (or you can bring the meter itself).  You can write it on any piece of paper.  please call us sooner if your blood sugar goes below 70, or if you have a lot of readings over 200.  For now, please: Stop taking the januvia, and: Increase the insulin to 40 units each morning.  Please come back for a follow-up appointment in 1 month.

## 2016-09-15 NOTE — Patient Instructions (Addendum)
check your blood sugar twice a day.  vary the time of day when you check, between before the 3 meals, and at bedtime.  also check if you have symptoms of your blood sugar being too high or too low.  please keep a record of the readings and bring it to your next appointment here (or you can bring the meter itself).  You can write it on any piece of paper.  please call us sooner if your blood sugar goes below 70, or if you have a lot of readings over 200.  For now, please: Stop taking the januvia, and: Increase the insulin to 40 units each morning.  Please come back for a follow-up appointment in 1 month.

## 2016-09-17 ENCOUNTER — Other Ambulatory Visit: Payer: Self-pay | Admitting: Internal Medicine

## 2016-10-18 ENCOUNTER — Encounter: Payer: Self-pay | Admitting: Endocrinology

## 2016-10-18 ENCOUNTER — Ambulatory Visit (INDEPENDENT_AMBULATORY_CARE_PROVIDER_SITE_OTHER): Payer: BLUE CROSS/BLUE SHIELD | Admitting: Endocrinology

## 2016-10-18 VITALS — BP 132/84 | HR 85 | Ht 71.0 in | Wt 186.0 lb

## 2016-10-18 DIAGNOSIS — E1122 Type 2 diabetes mellitus with diabetic chronic kidney disease: Secondary | ICD-10-CM | POA: Diagnosis not present

## 2016-10-18 DIAGNOSIS — N183 Chronic kidney disease, stage 3 (moderate): Secondary | ICD-10-CM

## 2016-10-18 DIAGNOSIS — Z794 Long term (current) use of insulin: Secondary | ICD-10-CM

## 2016-10-18 MED ORDER — BASAGLAR KWIKPEN 100 UNIT/ML ~~LOC~~ SOPN: 50.0000 [IU] | PEN_INJECTOR | SUBCUTANEOUS | 11 refills | Status: DC

## 2016-10-18 NOTE — Progress Notes (Signed)
Subjective:    Patient ID: Joe Wise, male    DOB: [birth year], 61 y.o.   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  Complications: renal insufficiency.  Therapy: insulin since 2018 DKA: never.  Severe hypoglycemia: never.  Pancreatitis: never.  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  Interval history: He brings a meter with his cbg's which I have reviewed today.  He checks fasting only.  It varies from 160-200's.   Past Medical History:  Diagnosis Date  . BPH (benign prostatic hyperplasia)   . Chronic kidney disease Nov '13   nephrolithiasis 68m left - passed  . Diabetes mellitus   . H/O urinary retention Jan '14   secondary to decongestants - resolved.  . Hyperlipidemia   . Hypertension   . Other testicular hypofunction     Past Surgical History:  Procedure Laterality Date  . MOUTH SURGERY    . MOUTH SURGERY  2012   with removal of a hard palate boney deformith  . NASAL SINUS SURGERY      Social History   Social History  . Marital status: Divorced    Spouse name: N/A  . Number of children: 2  . Years of education: 14   Occupational History  . load operator    Social History Main Topics  . Smoking status: Never Smoker  . Smokeless tobacco: Current User    Types: Snuff     Comment: counselled  . Alcohol use 1.0 oz/week    2 Standard drinks or equivalent per week     Comment: couple of beers  . Drug use: No  . Sexual activity: Yes    Partners: Female   Other Topics Concern  . Not on file   Social History Narrative   HSG, [place]-2 years. Married '83-seperated/divorced '06. 1 son '81; 1 daughter ''86; 1 grandchild (girl) '06. 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    Current Outpatient Prescriptions on File Prior to Visit  Medication  Sig Dispense Refill  . amitriptyline (ELAVIL) 25 MG tablet TAKE 1 TABLET BY MOUTH AT BEDTIME AS NEEDED FOR SLEEP 90 tablet 3  . Blood Glucose Monitoring Suppl (ONETOUCH VERIO IQ SYSTEM) w/Device KIT Use to check blood sugar two times per day. 1 kit 2  . dutasteride (AVODART) 0.5 MG capsule Take 1 capsule (0.5 mg total) by mouth daily.    . finasteride (PROSCAR) 5 MG tablet Take 1 tablet by mouth daily.  3  . glucose blood (ONETOUCH VERIO) test strip Use to check blood sugar two times per day. 100 each 5  . halobetasol (ULTRAVATE) 0.05 % cream APPLY TOPICALLY 2 (TWO) TIMES DAILY. 30 g 1  . lisinopril-hydrochlorothiazide (PRINZIDE,ZESTORETIC) 20-25 MG tablet TAKE 1 TABLET BY MOUTH EVERY DAY 90 tablet 3  . loratadine (CLARITIN) 10 MG tablet Take 1 tablet (10 mg total) by mouth daily. 100 tablet 3  . MICROLET LANCETS MISC Use to check blood sugar 2 times per day. Dx code: E11.9 150 each 2  . mometasone (NASONEX) 50 MCG/ACT nasal spray Place 2 sprays into the nose daily. 17 g 12  . simvastatin (ZOCOR) 20 MG tablet TAKE 1 TABLET BY MOUTH AT BEDTIME 90 tablet 3  . Testosterone (ANDROGEL PUMP TD) Place onto the skin.  1% gel; apply daily     No current facility-administered medications on file prior to visit.     Allergies  Allergen Reactions  . Sudafed [Pseudoephedrine]     Family History  Problem Relation Age of Onset  . Arthritis Mother   . Alcohol abuse Brother   . Cancer Brother   . Alcohol abuse Brother   . Cancer Brother   . Hypertension Brother   . Diabetes Neg Hx   . Heart disease Neg Hx     BP 132/84   Pulse 85   Ht '5\' 11"'$  (1.803 m)   Wt 186 lb (84.4 kg)   SpO2 96%   BMI 25.94 kg/m   Review of Systems He denies hypoglycemia.     Objective:   Physical Exam VITAL SIGNS:  See vs page.  GENERAL: no distress.  Pulses: dorsalis pedis intact bilat.   MSK: no deformity of the feet.  CV: no leg edema.  Skin:  no ulcer on the feet.  normal color and temp on the feet.    Neuro: sensation is intact to touch on the feet.   Lab Results  Component Value Date   CREATININE 1.61 (H) 09/09/2016   BUN 25 (H) 09/09/2016   NA 134 (L) 09/09/2016   K 4.0 09/09/2016   CL 97 09/09/2016   CO2 31 09/09/2016       Assessment & Plan:  Insulin-requiring type 2 DM, with renal insuff: he needs increased rx.   Patient Instructions  check your blood sugar twice a day.  vary the time of day when you check, between before the 3 meals, and at bedtime.  also check if you have symptoms of your blood sugar being too high or too low.  please keep a record of the readings and bring it to your next appointment here (or you can bring the meter itself).  You can write it on any piece of paper.  please call us sooner if your blood sugar goes below 70, or if you have a lot of readings over 200.  Please increase the insulin to 50 units each morning.  Please come back for a follow-up appointment in 1 month.

## 2016-10-18 NOTE — Patient Instructions (Addendum)
check your blood sugar twice a day.  vary the time of day when you check, between before the 3 meals, and at bedtime.  also check if you have symptoms of your blood sugar being too high or too low.  please keep a record of the readings and bring it to your next appointment here (or you can bring the meter itself).  You can write it on any piece of paper.  please call us sooner if your blood sugar goes below 70, or if you have a lot of readings over 200.  Please increase the insulin to 50 units each morning.   Please come back for a follow-up appointment in 1 month.    

## 2016-11-17 ENCOUNTER — Ambulatory Visit (INDEPENDENT_AMBULATORY_CARE_PROVIDER_SITE_OTHER): Payer: BLUE CROSS/BLUE SHIELD | Admitting: Endocrinology

## 2016-11-17 ENCOUNTER — Encounter: Payer: Self-pay | Admitting: Endocrinology

## 2016-11-17 VITALS — BP 122/84 | HR 82 | Ht 71.0 in | Wt 184.0 lb

## 2016-11-17 DIAGNOSIS — N183 Chronic kidney disease, stage 3 unspecified: Secondary | ICD-10-CM

## 2016-11-17 DIAGNOSIS — Z794 Long term (current) use of insulin: Secondary | ICD-10-CM | POA: Diagnosis not present

## 2016-11-17 DIAGNOSIS — E1122 Type 2 diabetes mellitus with diabetic chronic kidney disease: Secondary | ICD-10-CM | POA: Diagnosis not present

## 2016-11-17 LAB — POCT GLYCOSYLATED HEMOGLOBIN (HGB A1C): HEMOGLOBIN A1C: 9.9

## 2016-11-17 MED ORDER — BASAGLAR KWIKPEN 100 UNIT/ML ~~LOC~~ SOPN: 60.0000 [IU] | PEN_INJECTOR | SUBCUTANEOUS | 11 refills | Status: DC

## 2016-11-17 NOTE — Patient Instructions (Addendum)
check your blood sugar twice a day.  vary the time of day when you check, between before the 3 meals, and at bedtime.  also check if you have symptoms of your blood sugar being too high or too low.  please keep a record of the readings and bring it to your next appointment here (or you can bring the meter itself).  You can write it on any piece of paper.  please call us sooner if your blood sugar goes below 70, or if you have a lot of readings over 200.  Please increase the insulin to 60 units each morning.  Please come back for a follow-up appointment in 2 months.

## 2016-11-17 NOTE — Progress Notes (Signed)
Subjective:    Patient ID: Joe Wise, male    DOB: 1956/04/10, 60 y.o.   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   Dx'ed: 2007.  Complications: renal insufficiency.  Therapy: insulin since 2018.   DKA: never.  Severe hypoglycemia: never.  Pancreatitis: never.  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  Interval history: He brings a meter with his cbg's which I have reviewed today.  He checks fasting only.  It varies from 167-200's. He says he never misses the insulin.   Past Medical History:  Diagnosis Date  . BPH (benign prostatic hyperplasia)   . Chronic kidney disease Nov '13   nephrolithiasis 43m left - passed  . Diabetes mellitus   . H/O urinary retention Jan '14   secondary to decongestants - resolved.  . Hyperlipidemia   . Hypertension   . Other testicular hypofunction     Past Surgical History:  Procedure Laterality Date  . MOUTH SURGERY    . MOUTH SURGERY  2012   with removal of a hard palate boney deformith  . NASAL SINUS SURGERY      Social History   Social History  . Marital status: Divorced    Spouse name: N/A  . Number of children: 2  . Years of education: 14   Occupational History  . load operator    Social History Main Topics  . Smoking status: Never Smoker  . Smokeless tobacco: Current User    Types: Snuff     Comment: counselled  . Alcohol use 1.0 oz/week    2 Standard drinks or equivalent per week     Comment: couple of beers  . Drug use: No  . Sexual activity: Yes    Partners: Female   Other Topics Concern  . Not on file   Social History Narrative   [place], [place]-2 years. Married '83-seperated/divorced '06. 1 son '81; 1 daughter ''86; 1 grandchild (girl) '06. 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    Current Outpatient  Prescriptions on File Prior to Visit  Medication Sig Dispense Refill  . amitriptyline (ELAVIL) 25 MG tablet TAKE 1 TABLET BY MOUTH AT BEDTIME AS NEEDED FOR SLEEP 90 tablet 3  . Blood Glucose Monitoring Suppl (ONETOUCH VERIO IQ SYSTEM) w/Device KIT Use to check blood sugar two times per day. 1 kit 2  . dutasteride (AVODART) 0.5 MG capsule Take 1 capsule (0.5 mg total) by mouth daily.    . finasteride (PROSCAR) 5 MG tablet Take 1 tablet by mouth daily.  3  . glucose blood (ONETOUCH VERIO) test strip Use to check blood sugar two times per day. 100 each 5  . halobetasol (ULTRAVATE) 0.05 % cream APPLY TOPICALLY 2 (TWO) TIMES DAILY. 30 g 1  . lisinopril-hydrochlorothiazide (PRINZIDE,ZESTORETIC) 20-25 MG tablet TAKE 1 TABLET BY MOUTH EVERY DAY 90 tablet 3  . loratadine (CLARITIN) 10 MG tablet Take 1 tablet (10 mg total) by mouth daily. 100 tablet 3  . MICROLET LANCETS MISC Use to check blood sugar 2 times per day. Dx code: E11.9 150 each 2  . mometasone (NASONEX) 50 MCG/ACT nasal spray Place 2 sprays into the nose daily. 17 g 12  . simvastatin (ZOCOR) 20 MG tablet TAKE 1 TABLET BY MOUTH AT BEDTIME 90 tablet  3  . Testosterone (ANDROGEL PUMP TD) Place onto the skin. 1% gel; apply daily     No current facility-administered medications on file prior to visit.     Allergies  Allergen Reactions  . Sudafed [Pseudoephedrine]     Family History  Problem Relation Age of Onset  . Arthritis Mother   . Alcohol abuse Brother   . Cancer Brother   . Alcohol abuse Brother   . Cancer Brother   . Hypertension Brother   . Diabetes Neg Hx   . Heart disease Neg Hx     BP 122/84   Pulse 82   Ht '5\' 11"'$  (1.803 m)   Wt 184 lb (83.5 kg)   SpO2 96%   BMI 25.66 kg/m    Review of Systems He denies hypoglycemia    Objective:   Physical Exam VITAL SIGNS:  See vs page.  GENERAL: no distress.  Pulses: dorsalis pedis intact bilat.   MSK: no deformity of the feet.  CV: no leg edema.  Skin:  no ulcer on  the feet.  normal color and temp on the feet.  Neuro: sensation is intact to touch on the feet.   A1c=9.9%    Assessment & Plan:  DM: improved, but he needs increased rx.    Patient Instructions  check your blood sugar twice a day.  vary the time of day when you check, between before the 3 meals, and at bedtime.  also check if you have symptoms of your blood sugar being too high or too low.  please keep a record of the readings and bring it to your next appointment here (or you can bring the meter itself).  You can write it on any piece of paper.  please call us sooner if your blood sugar goes below 70, or if you have a lot of readings over 200.  Please increase the insulin to 60 units each morning.  Please come back for a follow-up appointment in 2 months.

## 2016-12-14 ENCOUNTER — Other Ambulatory Visit: Payer: Self-pay

## 2016-12-14 MED ORDER — INSULIN DEGLUDEC 100 UNIT/ML ~~LOC~~ SOPN: PEN_INJECTOR | SUBCUTANEOUS | 2 refills | Status: DC

## 2016-12-20 ENCOUNTER — Other Ambulatory Visit: Payer: Self-pay

## 2016-12-20 MED ORDER — INSULIN DEGLUDEC 100 UNIT/ML ~~LOC~~ SOPN: PEN_INJECTOR | SUBCUTANEOUS | 2 refills | Status: DC

## 2017-01-14 ENCOUNTER — Encounter (HOSPITAL_BASED_OUTPATIENT_CLINIC_OR_DEPARTMENT_OTHER): Payer: Self-pay | Admitting: Emergency Medicine

## 2017-01-14 ENCOUNTER — Emergency Department (HOSPITAL_BASED_OUTPATIENT_CLINIC_OR_DEPARTMENT_OTHER)
Admission: EM | Admit: 2017-01-14 | Discharge: 2017-01-14 | Disposition: A | Discharge status: Home / Self Care | Payer: BLUE CROSS/BLUE SHIELD | Attending: Emergency Medicine | Admitting: Emergency Medicine

## 2017-01-14 DIAGNOSIS — H8110 Benign paroxysmal vertigo, unspecified ear: Secondary | ICD-10-CM | POA: Diagnosis not present

## 2017-01-14 DIAGNOSIS — E1122 Type 2 diabetes mellitus with diabetic chronic kidney disease: Secondary | ICD-10-CM | POA: Insufficient documentation

## 2017-01-14 DIAGNOSIS — I129 Hypertensive chronic kidney disease with stage 1 through stage 4 chronic kidney disease, or unspecified chronic kidney disease: Secondary | ICD-10-CM | POA: Diagnosis not present

## 2017-01-14 DIAGNOSIS — R42 Dizziness and giddiness: Secondary | ICD-10-CM

## 2017-01-14 DIAGNOSIS — Z79899 Other long term (current) drug therapy: Secondary | ICD-10-CM | POA: Diagnosis not present

## 2017-01-14 DIAGNOSIS — N189 Chronic kidney disease, unspecified: Secondary | ICD-10-CM | POA: Insufficient documentation

## 2017-01-14 LAB — URINALYSIS, ROUTINE W REFLEX MICROSCOPIC
Bilirubin Urine: NEGATIVE
GLUCOSE, UA: 250 mg/dL — AB
Hgb urine dipstick: NEGATIVE
Ketones, ur: NEGATIVE mg/dL
LEUKOCYTES UA: NEGATIVE
Nitrite: NEGATIVE
PH: 7.5 (ref 5.0–8.0)
Protein, ur: NEGATIVE mg/dL
SPECIFIC GRAVITY, URINE: 1.024 (ref 1.005–1.030)

## 2017-01-14 LAB — BASIC METABOLIC PANEL
Anion gap: 8 (ref 5–15)
BUN: 22 mg/dL — ABNORMAL HIGH (ref 6–20)
CHLORIDE: 101 mmol/L (ref 101–111)
CO2: 29 mmol/L (ref 22–32)
Calcium: 9.5 mg/dL (ref 8.9–10.3)
Creatinine, Ser: 1.51 mg/dL — ABNORMAL HIGH (ref 0.61–1.24)
GFR calc non Af Amer: 48 mL/min — ABNORMAL LOW (ref >60)
GFR, EST AFRICAN AMERICAN: 56 mL/min — AB (ref >60)
Glucose, Bld: 197 mg/dL — ABNORMAL HIGH (ref 65–99)
POTASSIUM: 4.4 mmol/L (ref 3.5–5.1)
SODIUM: 138 mmol/L (ref 135–145)

## 2017-01-14 LAB — CBC
HEMATOCRIT: 41.4 % (ref 39.0–52.0)
Hemoglobin: 14.2 g/dL (ref 13.0–17.0)
MCH: 29 pg (ref 26.0–34.0)
MCHC: 34.3 g/dL (ref 30.0–36.0)
MCV: 84.5 fL (ref 78.0–100.0)
Platelets: 174 10*3/uL (ref 150–400)
RBC: 4.9 MIL/uL (ref 4.22–5.81)
RDW: 13.4 % (ref 11.5–15.5)
WBC: 6.6 10*3/uL (ref 4.0–10.5)

## 2017-01-14 LAB — CBG MONITORING, ED: GLUCOSE-CAPILLARY: 193 mg/dL — AB (ref 65–99)

## 2017-01-14 LAB — TROPONIN I: Troponin I: <0.03 ng/mL (ref <0.03)

## 2017-01-14 MED ORDER — MECLIZINE HCL 25 MG PO TABS
50.0000 mg | ORAL_TABLET | Freq: Once | ORAL | Status: AC
  Administered 2017-01-14: 50 mg via ORAL
  Filled 2017-01-14: qty 2

## 2017-01-14 MED ORDER — MECLIZINE HCL 25 MG PO TABS: 25.0000 mg | ORAL_TABLET | Freq: Three times a day (TID) | ORAL | 0 refills | Status: AC | PRN

## 2017-01-14 NOTE — ED Triage Notes (Signed)
patient states that he went to bed last night and was fine. The patient reports that he woke up at 0900 with dizziness. The patient reports that since then he has been dizzy especially when he moves, or stands up

## 2017-01-14 NOTE — ED Notes (Signed)
Denies LOC. States this has happened to him before many years ago but he is unsure of the diagnosis. Pt is alert and oriented. Speech is clear. Neuro assessment completed.

## 2017-01-14 NOTE — ED Notes (Signed)
Patient is on cardiac monitor, vitals cycling every hour

## 2017-01-14 NOTE — Discharge Instructions (Signed)
We suspect that you have vertigo, which will get better with exercises and medicine we prescribe. See your doctor in 2 weeks. Sometimes medications can be the underlying cause - so it is prudent to see your doctor.  As discussed, this doesn't appear to be stroke. However, if the symptoms get constant and you feel dizzy even when laying still - or you start having new numbness, tingling ,vision changes, weakness - return to the ER immediately.

## 2017-01-14 NOTE — ED Notes (Signed)
Pt on cardiac monitor and auto VS 

## 2017-01-14 NOTE — ED Notes (Signed)
Assumed care of patient from [place]. Pt resting quietly at this time. No distress. No complaints. Call bell within reach. Bedside report completed.

## 2017-01-14 NOTE — ED Provider Notes (Signed)
[facility] DEPT [facility] Provider Note   CSN: [id number] Arrival date & time: 01/14/17  1318     History   Chief Complaint Chief Complaint  Patient presents with  . Dizziness    HPI Joe Wise is a 61 y.o. male.  HPI Pt comes in with cc of dizziness. Pt has hx of HTN, HL, DM and he takes testosterone. No hx of strokes/ TIA. Pt reports that he started feeling dizzy when he woke up today. Pt noted that the dizziness was present when he got up and moved his neck, and the dizziness improved when he laid still. Since that time, he continues to get episodic dizziness, usually when he moves his neck or when he walking. Pt has no associated numbness, tingling, vision changes, focal weakness, slurred speech or confusion. Pt is not on any new meds. He has no hearing loss or tinnitus. No hx of similar symptoms in the past.  During the history taking pt has no dizziness. We ambulated the patient, and he felt slightly dizzy. We asked him to move his nexk, and he again felt slightly dizzy.  Past Medical History:  Diagnosis Date  . BPH (benign prostatic hyperplasia)   . Chronic kidney disease Nov '13   nephrolithiasis 7m left - passed  . Diabetes mellitus   . H/O urinary retention Jan '14   secondary to decongestants - resolved.  . Hyperlipidemia   . Hypertension   . Other testicular hypofunction     Patient Active Problem List   Diagnosis Date Noted  . Allergic rhinitis 10/28/2015  . Rash and nonspecific skin eruption 10/28/2015  . Pain in joint, shoulder region 09/01/2014  . Adrenal nodule ([facility] 11/25/2012  . Routine health maintenance 11/22/2011  . ELEVATED PROSTATE SPECIFIC ANTIGEN 07/16/2009  . Hypogonadism in male 06/29/2007  . Type 2 diabetes, controlled, with renal manifestation [place] 06/08/2007  . Dyslipidemia 06/08/2007  . Essential hypertension 06/08/2007    Past Surgical History:  Procedure Laterality Date  . MOUTH SURGERY    . MOUTH SURGERY  2012   with  removal of a hard palate boney deformith  . NASAL SINUS SURGERY         Home Medications    Prior to Admission medications   Medication Sig Start Date End Date Taking? Authorizing Provider  amitriptyline (ELAVIL) 25 MG tablet TAKE 1 TABLET BY MOUTH AT BEDTIME AS NEEDED FOR SLEEP 01/22/16   Plotnikov, AEvie Lacks MD  Blood Glucose Monitoring Suppl (ONETOUCH VERIO IQ SYSTEM) w/Device KIT Use to check blood sugar two times per day. 07/04/16   ERenato Shin MD  dutasteride (AVODART) 0.5 MG capsule Take 1 capsule (0.5 mg total) by mouth daily. 11/22/12   Norins, MHeinz Knuckles MD  finasteride (PROSCAR) 5 MG tablet Take 1 tablet by mouth daily. 08/26/15   [provider]  glucose blood (ONETOUCH VERIO) test strip Use to check blood sugar two times per day. 07/04/16   ERenato Shin MD  halobetasol (ULTRAVATE) 0.05 % cream APPLY TOPICALLY 2 (TWO) TIMES DAILY. 09/19/16   Plotnikov, AEvie Lacks MD  insulin degludec (TRESIBA FLEXTOUCH) 100 UNIT/ML SOPN FlexTouch Pen 60 units daily. 12/20/16   GPhilemon Kingdom MD  lisinopril-hydrochlorothiazide (PRINZIDE,ZESTORETIC) 20-25 MG tablet TAKE 1 TABLET BY MOUTH EVERY DAY 03/31/16   Plotnikov, AEvie Lacks MD  loratadine (CLARITIN) 10 MG tablet Take 1 tablet (10 mg total) by mouth daily. 10/28/15   Plotnikov, AEvie Lacks MD  meclizine (ANTIVERT) 25 MG tablet Take 1 tablet (25 mg  total) by mouth 3 (three) times daily as needed for dizziness. 01/14/17   Varney Biles, MD  MICROLET LANCETS MISC Use to check blood sugar 2 times per day. Dx code: E11.9 08/29/16   Renato Shin, MD  mometasone (NASONEX) 50 MCG/ACT nasal spray Place 2 sprays into the nose daily. 10/28/15   Plotnikov, Evie Lacks, MD  simvastatin (ZOCOR) 20 MG tablet TAKE 1 TABLET BY MOUTH AT BEDTIME 03/31/16   Plotnikov, Evie Lacks, MD  Testosterone (ANDROGEL PUMP TD) Place onto the skin. 1% gel; apply daily    [provider]    Family History Family History  Problem Relation Age of Onset  . Arthritis  Mother   . Alcohol abuse Brother   . Cancer Brother   . Alcohol abuse Brother   . Cancer Brother   . Hypertension Brother   . Diabetes Neg Hx   . Heart disease Neg Hx     Social History Social History  Substance Use Topics  . Smoking status: Never Smoker  . Smokeless tobacco: Current User    Types: Snuff     Comment: counselled  . Alcohol use 1.0 oz/week    2 Standard drinks or equivalent per week     Comment: couple of beers     Allergies   Sudafed [pseudoephedrine]   Review of Systems Review of Systems  HENT: Negative for congestion and hearing loss.   Eyes: Negative for photophobia and visual disturbance.  Respiratory: Negative for shortness of breath.   Cardiovascular: Negative for chest pain.  Allergic/Immunologic: Negative for immunocompromised state.  Neurological: Positive for dizziness. Negative for facial asymmetry, speech difficulty, weakness, numbness and headaches.  All other systems reviewed and are negative.    Physical Exam Updated Vital Signs BP 140/88   Pulse 63   Temp 98.4 F (36.9 C) (Oral)   Resp 11   Ht '5\' 11"'$  (1.803 m)   Wt 85.3 kg (188 lb)   SpO2 98%   BMI 26.22 kg/m   Physical Exam  Constitutional: He is oriented to person, place, and time. He appears well-developed.  HENT:  Head: Normocephalic and atraumatic.  Eyes: Pupils are equal, round, and reactive to light. Conjunctivae and EOM are normal.  No nystagmus  Neck: Normal range of motion. Neck supple.  Cardiovascular: Normal rate and regular rhythm.   Pulmonary/Chest: Effort normal and breath sounds normal.  Abdominal: Soft. Bowel sounds are normal. He exhibits no distension and no mass. There is no tenderness. There is no rebound and no guarding.  Musculoskeletal: He exhibits no deformity.  Neurological: He is alert and oriented to person, place, and time. No cranial nerve deficit. Coordination normal.  Cerebellar exam is normal (finger to nose) Sensory exam normal for  bilateral upper and lower extremities - and patient is able to discriminate between sharp and dull. Motor exam is 4+/5 HINTS neg.  Pt ambulated without any difficulty  Skin: Skin is warm.  Nursing note and vitals reviewed.    ED Treatments / Results  Labs (all labs ordered are listed, but only abnormal results are displayed) Labs Reviewed  BASIC METABOLIC PANEL - Abnormal; Notable for the following:       Result Value   Glucose, Bld 197 (*)    BUN 22 (*)    Creatinine, Ser 1.51 (*)    GFR calc non Af Amer 48 (*)    GFR calc Af Amer 56 (*)    All other components within normal limits  URINALYSIS, ROUTINE W  REFLEX MICROSCOPIC - Abnormal; Notable for the following:    Glucose, UA 250 (*)    All other components within normal limits  CBG MONITORING, ED - Abnormal; Notable for the following:    Glucose-Capillary 193 (*)    All other components within normal limits  CBC  TROPONIN I  CBG MONITORING, ED    EKG  EKG Interpretation None       Radiology No results found.  Procedures Procedures (including critical care time)  Medications Ordered in ED Medications  meclizine (ANTIVERT) tablet 50 mg (50 mg Oral Given 01/14/17 1717)     Initial Impression / Assessment and Plan / ED Course  I have reviewed the triage vital signs and the nursing notes.  Pertinent labs & imaging results that were available during my care of the patient were reviewed by me and considered in my medical decision making (see chart for details).     DDx includes: Central vertigo:  Tumor  Stroke  ICH  Vertebrobasilar TIA  Peripheral Vertigo:  BPPV  Vestibular neuritis  Meniere disease  Migrainous vertigo  Ear Infection   Pt comes in with cc of dizziness, described as unsteadiness and lightheadedness. Symptoms are intermittent, not present during the history taking, and typically present when pt is ambulating or reproduced with him turning his neck. There are no ear symptoms. Pt's  dizziness when presents is not severe. + nausea- no other neurologic dysfunction. Pt has no focal neuro deficits, HINTS neg - but he has stroke risk factors.  Clinical concerns for likely benign etiology discussed. We also discussed the possibility of this being a TIA - as the symptoms are intermittent.  Pt given meclezine and reassessed and he reports no repeat episodes. Pt is on HCTZ that maybe contributing. Plan is to discharge home as peripheral vertigo with pcp f/u. However, as the symptoms were not entirely classic for BPPV, and pt has stroke risk factors, strict ER return precautions have been discussed, and patient is agreeing with the plan and is comfortable with the workup done and the recommendations from the ER.    Final Clinical Impressions(s) / ED Diagnoses   Final diagnoses:  Benign paroxysmal positional vertigo, unspecified laterality  Dizziness    New Prescriptions New Prescriptions   MECLIZINE (ANTIVERT) 25 MG TABLET    Take 1 tablet (25 mg total) by mouth 3 (three) times daily as needed for dizziness.     Varney Biles, MD 01/14/17 1840

## 2017-01-18 ENCOUNTER — Encounter: Payer: Self-pay | Admitting: Endocrinology

## 2017-01-18 ENCOUNTER — Ambulatory Visit (INDEPENDENT_AMBULATORY_CARE_PROVIDER_SITE_OTHER): Payer: BLUE CROSS/BLUE SHIELD | Admitting: Endocrinology

## 2017-01-18 VITALS — BP 142/96 | HR 78 | Wt 186.6 lb

## 2017-01-18 DIAGNOSIS — Z794 Long term (current) use of insulin: Secondary | ICD-10-CM

## 2017-01-18 DIAGNOSIS — N183 Chronic kidney disease, stage 3 unspecified: Secondary | ICD-10-CM

## 2017-01-18 DIAGNOSIS — E1122 Type 2 diabetes mellitus with diabetic chronic kidney disease: Secondary | ICD-10-CM | POA: Diagnosis not present

## 2017-01-18 LAB — POCT GLYCOSYLATED HEMOGLOBIN (HGB A1C): HEMOGLOBIN A1C: 9.1

## 2017-01-18 MED ORDER — INSULIN DEGLUDEC 100 UNIT/ML ~~LOC~~ SOPN: 60.0000 [IU] | PEN_INJECTOR | SUBCUTANEOUS | 2 refills | Status: DC

## 2017-01-18 NOTE — Patient Instructions (Addendum)
check your blood sugar twice a day.  vary the time of day when you check, between before the 3 meals, and at bedtime.  also check if you have symptoms of your blood sugar being too high or too low.  please keep a record of the readings and bring it to your next appointment here (or you can bring the meter itself).  You can write it on any piece of paper.  please call us sooner if your blood sugar goes below 70, or if you have a lot of readings over 200.   Please increase the insulin to 70 units each morning.   Please come back for a follow-up appointment in 2 months.    

## 2017-01-18 NOTE — Progress Notes (Signed)
Subjective:    Patient ID: Joe Wise, male    DOB: [birth year], 61 y.o.   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   Dx'ed: 2007.  Complications: renal insufficiency.  Therapy: insulin since 2018.   DKA: never.  Severe hypoglycemia: never.  Pancreatitis: never.  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   Interval history: He brings his meter, with his cbg's which I have reviewed today. It varies from 157-200's.  There is no trend throughout the day.  He says he never misses the insulin.   Past Medical History:  Diagnosis Date  . BPH (benign prostatic hyperplasia)   . Chronic kidney disease Nov '13   nephrolithiasis 42m left - passed  . Diabetes mellitus   . H/O urinary retention Jan '14   secondary to decongestants - resolved.  . Hyperlipidemia   . Hypertension   . Other testicular hypofunction     Past Surgical History:  Procedure Laterality Date  . MOUTH SURGERY    . MOUTH SURGERY  2012   with removal of a hard palate boney deformith  . NASAL SINUS SURGERY      Social History   Social History  . Marital status: Divorced    Spouse name: N/A  . Number of children: 2  . Years of education: 14   Occupational History  . load operator    Social History Main Topics  . Smoking status: Never Smoker  . Smokeless tobacco: Current User    Types: Snuff     Comment: counselled  . Alcohol use 1.0 oz/week    2 Standard drinks or equivalent per week     Comment: couple of beers  . Drug use: No  . Sexual activity: Yes    Partners: Female   Other Topics Concern  . Not on file   Social History Narrative   [place]. Married '83-seperated/divorced '06. 1 son '81; 1 daughter ''86; 1 grandchild (girl) '06. 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    Current  Outpatient Prescriptions on File Prior to Visit  Medication Sig Dispense Refill  . amitriptyline (ELAVIL) 25 MG tablet TAKE 1 TABLET BY MOUTH AT BEDTIME AS NEEDED FOR SLEEP 90 tablet 3  . Blood Glucose Monitoring Suppl (ONETOUCH VERIO IQ SYSTEM) w/Device KIT Use to check blood sugar two times per day. 1 kit 2  . dutasteride (AVODART) 0.5 MG capsule Take 1 capsule (0.5 mg total) by mouth daily.    . finasteride (PROSCAR) 5 MG tablet Take 1 tablet by mouth daily.  3  . glucose blood (ONETOUCH VERIO) test strip Use to check blood sugar two times per day. 100 each 5  . halobetasol (ULTRAVATE) 0.05 % cream APPLY TOPICALLY 2 (TWO) TIMES DAILY. 30 g 1  . lisinopril-hydrochlorothiazide (PRINZIDE,ZESTORETIC) 20-25 MG tablet TAKE 1 TABLET BY MOUTH EVERY DAY 90 tablet 3  . loratadine (CLARITIN) 10 MG tablet Take 1 tablet (10 mg total) by mouth daily. 100 tablet 3  . meclizine (ANTIVERT) 25 MG tablet Take 1 tablet (25 mg total) by mouth 3 (three) times daily as needed for dizziness. 30 tablet 0  . MICROLET LANCETS MISC Use to check blood sugar 2 times per day. Dx code: E11.9 150 each 2  . mometasone (NASONEX)  50 MCG/ACT nasal spray Place 2 sprays into the nose daily. 17 g 12  . simvastatin (ZOCOR) 20 MG tablet TAKE 1 TABLET BY MOUTH AT BEDTIME 90 tablet 3  . Testosterone (ANDROGEL PUMP TD) Place onto the skin. 1% gel; apply daily     No current facility-administered medications on file prior to visit.     Allergies  Allergen Reactions  . Sudafed [Pseudoephedrine]     Family History  Problem Relation Age of Onset  . Arthritis Mother   . Alcohol abuse Brother   . Cancer Brother   . Alcohol abuse Brother   . Cancer Brother   . Hypertension Brother   . Diabetes Neg Hx   . Heart disease Neg Hx     BP (!) 142/96   Pulse 78   Wt 186 lb 9.6 oz (84.6 kg)   SpO2 96%   BMI 26.03 kg/m    Review of Systems He denies hypoglycemia.      Objective:   Physical Exam VITAL SIGNS:  See vs  page GENERAL: no distress Pulses: foot pulses are intact bilaterally.   MSK: no deformity of the feet or ankles.  CV: no edema of the legs or ankles Skin:  no ulcer on the feet or ankles.  normal color and temp on the feet and ankles Neuro: sensation is intact to touch on the feet and ankles.     Lab Results  Component Value Date   HGBA1C 9.1 01/18/2017      Assessment & Plan:  Insulin-requiring type 2 DM, with renal insuff: he needs increased rx  Patient Instructions  check your blood sugar twice a day.  vary the time of day when you check, between before the 3 meals, and at bedtime.  also check if you have symptoms of your blood sugar being too high or too low.  please keep a record of the readings and bring it to your next appointment here (or you can bring the meter itself).  You can write it on any piece of paper.  please call us sooner if your blood sugar goes below 70, or if you have a lot of readings over 200.  Please increase the insulin to 70 units each morning.  Please come back for a follow-up appointment in 2 months.

## 2017-02-16 ENCOUNTER — Other Ambulatory Visit: Payer: Self-pay | Admitting: Internal Medicine

## 2017-03-16 ENCOUNTER — Ambulatory Visit (INDEPENDENT_AMBULATORY_CARE_PROVIDER_SITE_OTHER): Payer: BLUE CROSS/BLUE SHIELD | Admitting: Internal Medicine

## 2017-03-16 ENCOUNTER — Encounter: Payer: Self-pay | Admitting: Internal Medicine

## 2017-03-16 ENCOUNTER — Other Ambulatory Visit (INDEPENDENT_AMBULATORY_CARE_PROVIDER_SITE_OTHER): Payer: BLUE CROSS/BLUE SHIELD

## 2017-03-16 DIAGNOSIS — Z0000 Encounter for general adult medical examination without abnormal findings: Secondary | ICD-10-CM | POA: Diagnosis not present

## 2017-03-16 DIAGNOSIS — E119 Type 2 diabetes mellitus without complications: Secondary | ICD-10-CM

## 2017-03-16 DIAGNOSIS — I10 Essential (primary) hypertension: Secondary | ICD-10-CM

## 2017-03-16 DIAGNOSIS — E279 Disorder of adrenal gland, unspecified: Secondary | ICD-10-CM | POA: Diagnosis not present

## 2017-03-16 DIAGNOSIS — Z77111 Contact with and (suspected) exposure to water pollution: Secondary | ICD-10-CM | POA: Diagnosis not present

## 2017-03-16 DIAGNOSIS — E278 Other specified disorders of adrenal gland: Secondary | ICD-10-CM

## 2017-03-16 HISTORY — DX: Contact with and (suspected) exposure to water pollution: Z77.111

## 2017-03-16 LAB — CBC WITH DIFFERENTIAL/PLATELET
BASOS PCT: 0.4 % (ref 0.0–3.0)
Basophils Absolute: 0 10*3/uL (ref 0.0–0.1)
Eosinophils Absolute: 0.2 10*3/uL (ref 0.0–0.7)
Eosinophils Relative: 4.1 % (ref 0.0–5.0)
HEMATOCRIT: 41.8 % (ref 39.0–52.0)
HEMOGLOBIN: 13.6 g/dL (ref 13.0–17.0)
LYMPHS PCT: 42.3 % (ref 12.0–46.0)
Lymphs Abs: 2.3 10*3/uL (ref 0.7–4.0)
MCHC: 32.5 g/dL (ref 30.0–36.0)
MCV: 88.5 fl (ref 78.0–100.0)
Monocytes Absolute: 0.5 10*3/uL (ref 0.1–1.0)
Monocytes Relative: 9.6 % (ref 3.0–12.0)
Neutro Abs: 2.4 10*3/uL (ref 1.4–7.7)
Neutrophils Relative %: 43.6 % (ref 43.0–77.0)
Platelets: 190 10*3/uL (ref 150.0–400.0)
RBC: 4.73 Mil/uL (ref 4.22–5.81)
RDW: 13.7 % (ref 11.5–15.5)
WBC: 5.4 10*3/uL (ref 4.0–10.5)

## 2017-03-16 LAB — HEPATIC FUNCTION PANEL
ALK PHOS: 50 U/L (ref 39–117)
ALT: 28 U/L (ref 0–53)
AST: 21 U/L (ref 0–37)
Albumin: 4.4 g/dL (ref 3.5–5.2)
BILIRUBIN TOTAL: 0.4 mg/dL (ref 0.2–1.2)
Bilirubin, Direct: 0.1 mg/dL (ref 0.0–0.3)
Total Protein: 6.6 g/dL (ref 6.0–8.3)

## 2017-03-16 LAB — URINALYSIS
Bilirubin Urine: NEGATIVE
Hgb urine dipstick: NEGATIVE
Ketones, ur: NEGATIVE
LEUKOCYTES UA: NEGATIVE
NITRITE: NEGATIVE
Total Protein, Urine: NEGATIVE
UROBILINOGEN UA: 0.2 (ref 0.0–1.0)
Urine Glucose: 250 — AB
pH: 5.5 (ref 5.0–8.0)

## 2017-03-16 LAB — LIPID PANEL
CHOL/HDL RATIO: 3
CHOLESTEROL: 162 mg/dL (ref 0–200)
HDL: 54.2 mg/dL (ref >39.00)
NonHDL: 107.94
Triglycerides: 216 mg/dL — ABNORMAL HIGH (ref 0.0–149.0)
VLDL: 43.2 mg/dL — AB (ref 0.0–40.0)

## 2017-03-16 LAB — BASIC METABOLIC PANEL
BUN: 27 mg/dL — ABNORMAL HIGH (ref 6–23)
CO2: 32 mEq/L (ref 19–32)
Calcium: 9.5 mg/dL (ref 8.4–10.5)
Chloride: 100 mEq/L (ref 96–112)
Creatinine, Ser: 1.65 mg/dL — ABNORMAL HIGH (ref 0.40–1.50)
GFR: 54.61 mL/min — AB (ref >60.00)
Glucose, Bld: 205 mg/dL — ABNORMAL HIGH (ref 70–99)
POTASSIUM: 3.7 meq/L (ref 3.5–5.1)
SODIUM: 140 meq/L (ref 135–145)

## 2017-03-16 LAB — HEMOGLOBIN A1C: HEMOGLOBIN A1C: 9.4 % — AB (ref 4.6–6.5)

## 2017-03-16 LAB — PSA: PSA: 2.34 ng/mL (ref 0.10–4.00)

## 2017-03-16 LAB — MICROALBUMIN / CREATININE URINE RATIO
CREATININE, U: 174.3 mg/dL
Microalb Creat Ratio: 0.6 mg/g (ref 0.0–30.0)
Microalb, Ur: 1 mg/dL (ref 0.0–1.9)

## 2017-03-16 LAB — TSH: TSH: 2.09 u[IU]/mL (ref 0.35–4.50)

## 2017-03-16 LAB — LDL CHOLESTEROL, DIRECT: LDL DIRECT: 60 mg/dL

## 2017-03-16 MED ORDER — FINASTERIDE 5 MG PO TABS: 5.0000 mg | ORAL_TABLET | Freq: Every day | ORAL | 3 refills | Status: AC

## 2017-03-16 MED ORDER — SIMVASTATIN 20 MG PO TABS: 20.0000 mg | ORAL_TABLET | Freq: Every day | ORAL | 3 refills | Status: AC

## 2017-03-16 MED ORDER — LISINOPRIL-HYDROCHLOROTHIAZIDE 20-25 MG PO TABS: 1.0000 | ORAL_TABLET | Freq: Every day | ORAL | 3 refills | Status: AC

## 2017-03-16 MED ORDER — AMITRIPTYLINE HCL 25 MG PO TABS: 25.0000 mg | ORAL_TABLET | Freq: Every evening | ORAL | 3 refills | Status: AC | PRN

## 2017-03-16 NOTE — Assessment & Plan Note (Signed)
Prinizide 

## 2017-03-16 NOTE — Assessment & Plan Note (Addendum)
[place], [place] and others exposure, remote The pt has CRI. He will submit a claim

## 2017-03-16 NOTE — Assessment & Plan Note (Signed)
We discussed age appropriate health related issues, including available/recomended screening tests and vaccinations. We discussed a need for adhering to healthy diet and exercise. Labs were ordered to be later reviewed . All questions were answered.  Cologuard 7/15

## 2017-03-16 NOTE — Progress Notes (Signed)
Subjective:  Patient ID: Joe Wise, male    DOB: [birth year]  Age: 61 y.o. MRN: [id number]  CC: No chief complaint on file.   HPI Joe Wise presents for a well  Planning to retire soon and restore old cars  Outpatient Medications Prior to Visit  Medication Sig Dispense Refill  . amitriptyline (ELAVIL) 25 MG tablet TAKE 1 TABLET BY MOUTH AT BEDTIME AS NEEDED FOR SLEEP 90 tablet 3  . Blood Glucose Monitoring Suppl (ONETOUCH VERIO IQ SYSTEM) w/Device KIT Use to check blood sugar two times per day. 1 kit 2  . dutasteride (AVODART) 0.5 MG capsule Take 1 capsule (0.5 mg total) by mouth daily.    . finasteride (PROSCAR) 5 MG tablet Take 1 tablet by mouth daily.  3  . glucose blood (ONETOUCH VERIO) test strip Use to check blood sugar two times per day. 100 each 5  . halobetasol (ULTRAVATE) 0.05 % cream APPLY TOPICALLY 2 (TWO) TIMES DAILY. 30 g 1  . insulin degludec (TRESIBA FLEXTOUCH) 100 UNIT/ML SOPN FlexTouch Pen Inject 0.6 mLs (60 Units total) into the skin every morning. And pen needles 1/day 30 mL 2  . lisinopril-hydrochlorothiazide (PRINZIDE,ZESTORETIC) 20-25 MG tablet TAKE 1 TABLET BY MOUTH EVERY DAY 90 tablet 3  . loratadine (CLARITIN) 10 MG tablet Take 1 tablet (10 mg total) by mouth daily. 100 tablet 3  . meclizine (ANTIVERT) 25 MG tablet Take 1 tablet (25 mg total) by mouth 3 (three) times daily as needed for dizziness. 30 tablet 0  . MICROLET LANCETS MISC Use to check blood sugar 2 times per day. Dx code: E11.9 150 each 2  . mometasone (NASONEX) 50 MCG/ACT nasal spray Place 2 sprays into the nose daily. 17 g 12  . simvastatin (ZOCOR) 20 MG tablet TAKE 1 TABLET BY MOUTH AT BEDTIME 90 tablet 3  . Testosterone (ANDROGEL PUMP TD) Place onto the skin. 1% gel; apply daily     No facility-administered medications prior to visit.     ROS Review of Systems  Constitutional: Negative for appetite change, fatigue and unexpected weight change.  HENT: Negative for congestion,  nosebleeds, sneezing, sore throat and trouble swallowing.   Eyes: Negative for itching and visual disturbance.  Respiratory: Negative for cough.   Cardiovascular: Negative for chest pain, palpitations and leg swelling.  Gastrointestinal: Negative for abdominal distention, blood in stool, diarrhea and nausea.  Genitourinary: Negative for frequency and hematuria.  Musculoskeletal: Negative for back pain, gait problem, joint swelling and neck pain.  Skin: Negative for rash.  Neurological: Negative for dizziness, tremors, speech difficulty and weakness.  Psychiatric/Behavioral: Negative for agitation, dysphoric mood and sleep disturbance. The patient is not nervous/anxious.     Objective:  BP 122/70 (BP Location: Left Arm, Patient Position: Sitting, Cuff Size: Large)   Pulse 76   Temp 98.2 F (36.8 C) (Oral)   Ht _0  (1.803 m)   Wt 189 lb (85.7 kg)   SpO2 99%   BMI 26.36 kg/m   BP Readings from Last 3 Encounters:  03/16/17 122/70  01/18/17 (!) 142/96  01/14/17 (!) 147/85    Wt Readings from Last 3 Encounters:  03/16/17 189 lb (85.7 kg)  01/18/17 186 lb 9.6 oz (84.6 kg)  01/14/17 188 lb (85.3 kg)    Physical Exam  Constitutional: He is oriented to person, place, and time. He appears well-developed and well-nourished. No distress.  HENT:  Head: Normocephalic and atraumatic.  Right Ear: External ear normal.  Left Ear: External  ear normal.  Nose: Nose normal.  Mouth/Throat: Oropharynx is clear and moist. No oropharyngeal exudate.  Eyes: Pupils are equal, round, and reactive to light. Conjunctivae and EOM are normal. Right eye exhibits no discharge. Left eye exhibits no discharge. No scleral icterus.  Neck: Normal range of motion. Neck supple. No JVD present. No tracheal deviation present. No thyromegaly present.  Cardiovascular: Normal rate, regular rhythm, normal heart sounds and intact distal pulses.  Exam reveals no gallop and no friction rub.   No murmur  heard. Pulmonary/Chest: Effort normal and breath sounds normal. No stridor. No respiratory distress. He has no wheezes. He has no rales. He exhibits no tenderness.  Abdominal: Soft. Bowel sounds are normal. He exhibits no distension and no mass. There is no tenderness. There is no rebound and no guarding.  Genitourinary: Rectum normal and penis normal. Rectal exam shows guaiac negative stool. No penile tenderness.  Musculoskeletal: Normal range of motion. He exhibits no edema or tenderness.  Lymphadenopathy:    He has no cervical adenopathy.  Neurological: He is alert and oriented to person, place, and time. He has normal reflexes. No cranial nerve deficit. He exhibits normal muscle tone. Coordination normal.  Skin: Skin is warm and dry. No rash noted. He is not diaphoretic. No erythema. No pallor.  Psychiatric: He has a normal mood and affect. His behavior is normal. Judgment and thought content normal.  prostate 1+  Lab Results  Component Value Date   WBC 6.6 01/14/2017   HGB 14.2 01/14/2017   HCT 41.4 01/14/2017   PLT 174 01/14/2017   GLUCOSE 197 (H) 01/14/2017   CHOL 160 09/09/2016   TRIG 116.0 09/09/2016   HDL 49.20 09/09/2016   LDLCALC 87 09/09/2016   ALT 19 09/09/2016   AST 15 09/09/2016   NA 138 01/14/2017   K 4.4 01/14/2017   CL 101 01/14/2017   CREATININE 1.51 (H) 01/14/2017   BUN 22 (H) 01/14/2017   CO2 29 01/14/2017   TSH 1.41 09/03/2010   PSA 6.12 (H) 09/03/2010   HGBA1C 9.1 01/18/2017   MICROALBUR 3.3 (H) 11/22/2012    No results found.  Assessment & Plan:   There are no diagnoses linked to this encounter. I am having Joe Wise maintain his Testosterone (ANDROGEL PUMP TD), dutasteride, finasteride, loratadine, mometasone, simvastatin, lisinopril-hydrochlorothiazide, glucose blood, ONETOUCH VERIO IQ SYSTEM, MICROLET LANCETS, halobetasol, meclizine, insulin degludec, and amitriptyline.  No orders of the defined types were placed in this  encounter.    Follow-up: No Follow-up on file.  Walker Kehr, MD

## 2017-03-16 NOTE — Assessment & Plan Note (Addendum)
Labs On Basiglar 20 u/d

## 2017-03-21 ENCOUNTER — Encounter: Payer: Self-pay | Admitting: Endocrinology

## 2017-03-21 ENCOUNTER — Ambulatory Visit (INDEPENDENT_AMBULATORY_CARE_PROVIDER_SITE_OTHER): Payer: BLUE CROSS/BLUE SHIELD | Admitting: Endocrinology

## 2017-03-21 VITALS — BP 128/76 | HR 78 | Ht 71.0 in | Wt 190.0 lb

## 2017-03-21 DIAGNOSIS — N183 Chronic kidney disease, stage 3 unspecified: Secondary | ICD-10-CM

## 2017-03-21 DIAGNOSIS — Z794 Long term (current) use of insulin: Secondary | ICD-10-CM | POA: Diagnosis not present

## 2017-03-21 DIAGNOSIS — E1122 Type 2 diabetes mellitus with diabetic chronic kidney disease: Secondary | ICD-10-CM

## 2017-03-21 MED ORDER — BASAGLAR KWIKPEN 100 UNIT/ML ~~LOC~~ SOPN: 80.0000 [IU] | PEN_INJECTOR | SUBCUTANEOUS | 11 refills | Status: DC

## 2017-03-21 NOTE — Patient Instructions (Addendum)
check your blood sugar twice a day.  vary the time of day when you check, between before the 3 meals, and at bedtime.  also check if you have symptoms of your blood sugar being too high or too low.  please keep a record of the readings and bring it to your next appointment here (or you can bring the meter itself).  You can write it on any piece of paper.  please call us sooner if your blood sugar goes below 70, or if you have a lot of readings over 200.   Please increase the insulin to 80 units each morning.   Please come back for a follow-up appointment in 2 months.   

## 2017-03-21 NOTE — Progress Notes (Signed)
Subjective:    Patient ID: Joe Wise, male    DOB: [birth year], 61 y.o.   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   Dx'ed: 2007.  Complications: renal insufficiency.  Therapy: insulin since 2018.   DKA: never.  Severe hypoglycemia: never.  Pancreatitis: never.  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   Interval history: He brings his meter, with his cbg's which I have reviewed today. It varies from 120-180.  There is no trend throughout the day.  He says he never misses the insulin.  Past Medical History:  Diagnosis Date  . BPH (benign prostatic hyperplasia)   . Chronic kidney disease Nov '13   nephrolithiasis 60m left - passed  . Diabetes mellitus   . H/O urinary retention Jan '14   secondary to decongestants - resolved.  . Hyperlipidemia   . Hypertension   . Other testicular hypofunction     Past Surgical History:  Procedure Laterality Date  . MOUTH SURGERY    . MOUTH SURGERY  2012   with removal of a hard palate boney deformith  . NASAL SINUS SURGERY      Social History   Social History  . Marital status: Divorced    Spouse name: N/A  . Number of children: 2  . Years of education: 14   Occupational History  . load operator    Social History Main Topics  . Smoking status: Never Smoker  . Smokeless tobacco: Current User    Types: Snuff     Comment: counselled  . Alcohol use 1.0 oz/week    2 Standard drinks or equivalent per week     Comment: couple of beers  . Drug use: No  . Sexual activity: Yes    Partners: Female   Other Topics Concern  . Not on file   Social History Narrative   [place], [place]2 years. Married '83-seperated/divorced '06. 1 son '81; 1 daughter ''86; 1 grandchild (girl) '06. 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    Current  Outpatient Prescriptions on File Prior to Visit  Medication Sig Dispense Refill  . amitriptyline (ELAVIL) 25 MG tablet Take 1 tablet (25 mg total) by mouth at bedtime as needed. for sleep 90 tablet 3  . Blood Glucose Monitoring Suppl (ONETOUCH VERIO IQ SYSTEM) w/Device KIT Use to check blood sugar two times per day. 1 kit 2  . finasteride (PROSCAR) 5 MG tablet Take 1 tablet (5 mg total) by mouth daily. 90 tablet 3  . glucose blood (ONETOUCH VERIO) test strip Use to check blood sugar two times per day. 100 each 5  . halobetasol (ULTRAVATE) 0.05 % cream APPLY TOPICALLY 2 (TWO) TIMES DAILY. 30 g 1  . lisinopril-hydrochlorothiazide (PRINZIDE,ZESTORETIC) 20-25 MG tablet Take 1 tablet by mouth daily. 90 tablet 3  . loratadine (CLARITIN) 10 MG tablet Take 1 tablet (10 mg total) by mouth daily. 100 tablet 3  . meclizine (ANTIVERT) 25 MG tablet Take 1 tablet (25 mg total) by mouth 3 (three) times daily as needed for dizziness. 30 tablet 0  . MICROLET LANCETS MISC Use to check blood sugar 2 times per day. Dx code: E11.9 150 each 2  . mometasone (NASONEX) 50 MCG/ACT nasal spray Place 2 sprays into the nose daily. 17 g  12  . simvastatin (ZOCOR) 20 MG tablet Take 1 tablet (20 mg total) by mouth at bedtime. 90 tablet 3  . Testosterone (ANDROGEL PUMP TD) Place onto the skin. 1% gel; apply daily    . insulin degludec (TRESIBA FLEXTOUCH) 100 UNIT/ML SOPN FlexTouch Pen Inject 0.6 mLs (60 Units total) into the skin every morning. And pen needles 1/day (Patient not taking: Reported on 03/21/2017) 30 mL 2   No current facility-administered medications on file prior to visit.     Allergies  Allergen Reactions  . Sudafed [Pseudoephedrine]     Family History  Problem Relation Age of Onset  . Arthritis Mother   . Alcohol abuse Brother   . Cancer Brother   . Alcohol abuse Brother   . Cancer Brother   . Hypertension Brother   . Diabetes Neg Hx   . Heart disease Neg Hx     BP 128/76   Pulse 78   Ht '5\' 11"'$   (1.803 m)   Wt 190 lb (86.2 kg)   SpO2 97%   BMI 26.50 kg/m    Review of Systems He denies hypoglycemia.      Objective:   Physical Exam VITAL SIGNS:  See vs page GENERAL: no distress Pulses: foot pulses are intact bilaterally.   MSK: no deformity of the feet or ankles.  CV: no edema of the legs or ankles Skin:  no ulcer on the feet or ankles.  normal color and temp on the feet and ankles Neuro: sensation is intact to touch on the feet and ankles.    Lab Results  Component Value Date   HGBA1C 9.4 (H) 03/16/2017   Lab Results  Component Value Date   CREATININE 1.65 (H) 03/16/2017   BUN 27 (H) 03/16/2017   NA 140 03/16/2017   K 3.7 03/16/2017   CL 100 03/16/2017   CO2 32 03/16/2017      Assessment & Plan:  Insulin-requiring type 2 DM, with renal insuff: he needs increased rx  Patient Instructions  check your blood sugar twice a day.  vary the time of day when you check, between before the 3 meals, and at bedtime.  also check if you have symptoms of your blood sugar being too high or too low.  please keep a record of the readings and bring it to your next appointment here (or you can bring the meter itself).  You can write it on any piece of paper.  please call us sooner if your blood sugar goes below 70, or if you have a lot of readings over 200.  Please increase the insulin to 80 units each morning.   Please come back for a follow-up appointment in 2 months.

## 2017-04-02 LAB — COLOGUARD: COLOGUARD: NEGATIVE

## 2017-04-05 ENCOUNTER — Telehealth: Payer: Self-pay | Admitting: Internal Medicine

## 2017-04-05 NOTE — Telephone Encounter (Signed)
Pt was seen on 03/16/2017 by Dr Posey Rea. He said that he was having some issues with vertigo and he did discuss this with Dr Posey Rea at that time. He explain that he woke up the other day and had another spell of vertigo and wanted to know if something could be prescribed that might help.

## 2017-04-08 NOTE — Telephone Encounter (Signed)
Try Antivert Do not take it when driving. Do not drive if dizzy See a doctor if not resolved Thx

## 2017-04-10 NOTE — Telephone Encounter (Signed)
Tried to call pt. No vm picked up to leave a message.  

## 2017-04-17 NOTE — Telephone Encounter (Signed)
Tried to call pt again. No vm picked up.

## 2017-04-19 ENCOUNTER — Telehealth: Payer: Self-pay

## 2017-04-19 NOTE — Telephone Encounter (Signed)
I have tried to reach patient, phone just rings and rings, no way to leave message---patient's name was on shingrix waitlist, I am calling to advise that we have more vaccines in the [facility] if he can come in on nurse visit to get started---can talk with Kayo Zion if any further questions, if patient calls back to schedule appt, let Sandra Tellefsen know so that both vaccines can be labeled with patient's name

## 2017-04-24 ENCOUNTER — Telehealth: Payer: Self-pay

## 2017-04-24 NOTE — Telephone Encounter (Signed)
Tried to call patient to advise that we have shingrix vaccines available, if patient calls back, he can schedule first shingrix on nurse schedule, let Helyn Schwan know so that both vaccines can be labeled

## 2017-05-04 ENCOUNTER — Encounter: Payer: Self-pay | Admitting: Internal Medicine

## 2017-05-22 ENCOUNTER — Ambulatory Visit: Payer: BLUE CROSS/BLUE SHIELD | Admitting: Endocrinology

## 2017-05-22 ENCOUNTER — Encounter: Payer: Self-pay | Admitting: Endocrinology

## 2017-05-22 VITALS — BP 128/72 | HR 84 | Wt 193.0 lb

## 2017-05-22 DIAGNOSIS — N183 Chronic kidney disease, stage 3 (moderate): Secondary | ICD-10-CM | POA: Diagnosis not present

## 2017-05-22 DIAGNOSIS — Z794 Long term (current) use of insulin: Secondary | ICD-10-CM

## 2017-05-22 DIAGNOSIS — E1122 Type 2 diabetes mellitus with diabetic chronic kidney disease: Secondary | ICD-10-CM

## 2017-05-22 LAB — POCT GLYCOSYLATED HEMOGLOBIN (HGB A1C): HEMOGLOBIN A1C: 8.2

## 2017-05-22 MED ORDER — BASAGLAR KWIKPEN 100 UNIT/ML ~~LOC~~ SOPN: 130.0000 [IU] | PEN_INJECTOR | SUBCUTANEOUS | 11 refills | Status: AC

## 2017-05-22 NOTE — Progress Notes (Signed)
Subjective:    Patient ID: Joe Wise, male    DOB: 1955/07/02, 61 y.o.   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   Dx'ed: 2007.  Complications: renal insufficiency.  Therapy: insulin since 2018.   DKA: never.  Severe hypoglycemia: never.  Pancreatitis: never.  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   Interval history: He brings his meter, with his cbg's which I have reviewed today. It varies from 110-235.  There is no trend throughout the day.  He says he never misses the insulin. He takes basaglar, 120 units qam.  Past Medical History:  Diagnosis Date  . BPH (benign prostatic hyperplasia)   . Chronic kidney disease Nov '13   nephrolithiasis 48m left - passed  . Diabetes mellitus   . H/O urinary retention Jan '14   secondary to decongestants - resolved.  . Hyperlipidemia   . Hypertension   . Other testicular hypofunction     Past Surgical History:  Procedure Laterality Date  . MOUTH SURGERY    . MOUTH SURGERY  2012   with removal of a hard palate boney deformith  . NASAL SINUS SURGERY      Social History   Socioeconomic History  . Marital status: Divorced    Spouse name: Not on file  . Number of children: 2  . Years of education: 163 . Highest education level: Not on file  Social Needs  . Financial resource strain: Not on file  . Food insecurity - worry: Not on file  . Food insecurity - inability: Not on file  . Transportation needs - medical: Not on file  . Transportation needs - non-medical: Not on file  Occupational History  . Occupation: load operator  Tobacco Use  . Smoking status: Never Smoker  . Smokeless tobacco: Current User    Types: Snuff  . Tobacco comment: counselled  Substance and Sexual Activity  . Alcohol use: Yes    Alcohol/week: 1.0 oz   Types: 2 Standard drinks or equivalent per week    Comment: couple of beers  . Drug use: No  . Sexual activity: Yes    Partners: Female  Other Topics Concern  . Not on file  Social History Narrative   [place], [place]2 years. Married '83-seperated/divorced '06. 1 son '81; 1 daughter ''86; 1 grandchild (girl) '06. 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    Current Outpatient Medications on File Prior to Visit  Medication Sig Dispense Refill  . amitriptyline (ELAVIL) 25 MG tablet Take 1 tablet (25 mg total) by mouth at bedtime as needed. for sleep 90 tablet 3  . Blood Glucose Monitoring Suppl (ONETOUCH VERIO IQ SYSTEM) w/Device KIT Use to check blood sugar two times per day. 1 kit 2  . finasteride (PROSCAR) 5 MG tablet Take 1 tablet (5 mg total) by mouth daily. 90 tablet 3  . glucose blood (ONETOUCH VERIO) test strip Use to check blood sugar two times per day. 100 each 5  . halobetasol (ULTRAVATE) 0.05 % cream APPLY TOPICALLY 2 (TWO) TIMES DAILY. 30 g 1  . lisinopril-hydrochlorothiazide (PRINZIDE,ZESTORETIC) 20-25 MG tablet Take 1 tablet by mouth daily. 90 tablet 3  . loratadine (CLARITIN) 10 MG tablet Take 1 tablet (10 mg total) by mouth daily. 100 tablet  3  . meclizine (ANTIVERT) 25 MG tablet Take 1 tablet (25 mg total) by mouth 3 (three) times daily as needed for dizziness. 30 tablet 0  . MICROLET LANCETS MISC Use to check blood sugar 2 times per day. Dx code: E11.9 150 each 2  . mometasone (NASONEX) 50 MCG/ACT nasal spray Place 2 sprays into the nose daily. 17 g 12  . simvastatin (ZOCOR) 20 MG tablet Take 1 tablet (20 mg total) by mouth at bedtime. 90 tablet 3  . Testosterone (ANDROGEL PUMP TD) Place onto the skin. 1% gel; apply daily     No current facility-administered medications on file prior to visit.     Allergies  Allergen Reactions  . Sudafed [Pseudoephedrine]     Family History  Problem Relation Age of Onset  . Arthritis Mother   . Alcohol abuse Brother   .  Cancer Brother   . Alcohol abuse Brother   . Cancer Brother   . Hypertension Brother   . Diabetes Neg Hx   . Heart disease Neg Hx     BP 128/72 (BP Location: Left Arm, Patient Position: Sitting, Cuff Size: Normal)   Pulse 84   Wt 193 lb (87.5 kg)   SpO2 97%   BMI 26.92 kg/m    Review of Systems He denies hypoglycemia    Objective:   Physical Exam VITAL SIGNS:  See vs page GENERAL: no distress Pulses: dorsalis pedis intact bilat.   MSK: no deformity of the feet CV: no leg edema Skin:  no ulcer on the feet.  normal color and temp on the feet. Neuro: sensation is intact to touch on the feet   Lab Results  Component Value Date   HGBA1C 8.2 05/22/2017       Assessment & Plan:  Insulin-requiring type 2 DM, with renal insuff: he needs increased rx   Patient Instructions  check your blood sugar twice a day.  vary the time of day when you check, between before the 3 meals, and at bedtime.  also check if you have symptoms of your blood sugar being too high or too low.  please keep a record of the readings and bring it to your next appointment here (or you can bring the meter itself).  You can write it on any piece of paper.  please call us sooner if your blood sugar goes below 70, or if you have a lot of readings over 200.  Please increase the basaglar to 130 units each morning.   Please come back for a follow-up appointment in 2 months.

## 2017-05-22 NOTE — Patient Instructions (Addendum)
check your blood sugar twice a day.  vary the time of day when you check, between before the 3 meals, and at bedtime.  also check if you have symptoms of your blood sugar being too high or too low.  please keep a record of the readings and bring it to your next appointment here (or you can bring the meter itself).  You can write it on any piece of paper.  please call us sooner if your blood sugar goes below 70, or if you have a lot of readings over 200.  Please increase the basaglar to 130 units each morning.   Please come back for a follow-up appointment in 2 months.

## 2020-04-13 ENCOUNTER — Telehealth: Payer: Self-pay

## 2022-08-01 ENCOUNTER — Ambulatory Visit: Payer: No Typology Code available for payment source

## 2023-05-19 ENCOUNTER — Other Ambulatory Visit: Payer: Self-pay

## 2023-05-19 ENCOUNTER — Emergency Department (HOSPITAL_BASED_OUTPATIENT_CLINIC_OR_DEPARTMENT_OTHER)
Admission: EM | Admit: 2023-05-19 | Discharge: 2023-05-19 | Disposition: A | Discharge status: Home / Self Care | Payer: No Typology Code available for payment source | Attending: Emergency Medicine | Admitting: Emergency Medicine

## 2023-05-19 ENCOUNTER — Encounter (HOSPITAL_BASED_OUTPATIENT_CLINIC_OR_DEPARTMENT_OTHER): Payer: Self-pay | Admitting: Emergency Medicine

## 2023-05-19 DIAGNOSIS — F03911 Unspecified dementia, unspecified severity, with agitation: Secondary | ICD-10-CM | POA: Insufficient documentation

## 2023-05-19 DIAGNOSIS — R4182 Altered mental status, unspecified: Secondary | ICD-10-CM | POA: Diagnosis present

## 2023-05-19 NOTE — ED Notes (Signed)
Discharge paperwork reviewed entirely with patient, and family including follow up care. Pain was under control. No prescriptions were called in, but all questions were addressed.  Pt verbalized understanding as well as all parties involved. No questions or concerns voiced at the time of discharge. No acute distress noted.   Pt ambulated out to PVA without incident or assistance.  Pt advised they will notify their PCP immediately.

## 2023-05-19 NOTE — ED Triage Notes (Addendum)
Family report pt was missing since 10 am today; pt has been recently evaluated for dementia, but the workup is in progress and family having trouble getting pt to appts d/t his refusal; pt is disoriented; family reports disorientation is at baseline

## 2023-05-19 NOTE — ED Provider Notes (Signed)
EMERGENCY DEPARTMENT AT [facility] Provider Note   CSN: [id number] Arrival date & time: 05/19/23  1959     History Chief Complaint  Patient presents with   Altered Mental Status    HPI 67 year old male brought in by family because he ran away from home today.  He has a diagnosis of cognitive impairment from the [place] but has had progression over the last 6 months.  He still has car keys and has been caring for himself he was not allowing for members to stay with him recently.  Today they realize that he was missing and the car was missing and please had to get involved.  Found him to [facility] over sitting on a park bench shivering. Brought in as family states that they did not realize how progressed his dementia has become. Primarily they state they are just requesting social work consultation for further recommendations.   Patient's recorded medical, surgical, social, medication list and allergies were reviewed in the Snapshot window as part of the initial history.   Review of Systems   Review of Systems  Unable to perform ROS: Mental status change    Physical Exam Updated Vital Signs BP (!) 156/82   Pulse 64   Temp 97.6 F (36.4 C) (Oral)   Resp 18   Ht 5\' 11"  (1.803 m)   Wt 79.4 kg   SpO2 100%   BMI 24.41 kg/m  Physical Exam Vitals and nursing note reviewed.  Constitutional:      General: He is not in acute distress.    Appearance: He is well-developed.  HENT:     Head: Normocephalic and atraumatic.  Eyes:     Conjunctiva/sclera: Conjunctivae normal.  Cardiovascular:     Rate and Rhythm: Normal rate and regular rhythm.     Heart sounds: No murmur heard. Pulmonary:     Effort: Pulmonary effort is normal. No respiratory distress.     Breath sounds: Normal breath sounds.  Abdominal:     Palpations: Abdomen is soft.     Tenderness: There is no abdominal tenderness.  Musculoskeletal:        General: No swelling.     Cervical back: Neck  supple.  Skin:    General: Skin is warm and dry.     Capillary Refill: Capillary refill takes less than 2 seconds.  Neurological:     Mental Status: He is alert.  Psychiatric:        Mood and Affect: Mood normal.      ED Course/ Medical Decision Making/ A&P    Procedures Procedures   Medications Ordered in ED Medications - No data to display  Medical Decision Making:   Overall well-appearing 67 year old.  He does have evidence of fairly substantial cognitive disease. He is unable to recall the events of today.  Tangential in his speech, frequently looking to family members to finish sentences. He does not have any acute complaints of his own. Family is worried about his safety at home.  The daughter is a CNA who will be staying with him to make sure that he is safe over the weekend but they are requesting assistance in coordinating care for the patient. I called the social work on-call and reported the case.  They stated that they would reach out to the family to give them actionable steps and discuss a safe disposition plan for the patient.  I considered alternative etiology of his altered mental status but family is insistent that this  is the same mental status that he has had for approximately 6 months with slow decline.  They state the only reason they are here today is that he ran away from home and they wanted recommendations for safe management while they try to get met with primary care.  Long conversation between social work and patient's family and they are in agreement on plan to involve Adult Protective Services, take the spark plugs out of the car and work with an outpatient social worker through [employer] for safe living situation arrangement.  Disposition:  I have considered need for hospitalization, however, considering all of the above, I believe this patient is stable for discharge at this time.  Patient/family educated about specific return  precautions for given chief complaint and symptoms.  Patient/family educated about follow-up with PCP.     Patient/family expressed understanding of return precautions and need for follow-up. Patient spoken to regarding all imaging and laboratory results and appropriate follow up for these results. All education provided in verbal form with additional information in written form. Time was allowed for answering of patient questions. Patient discharged.    Emergency Department Medication Summary:   Medications - No data to display    Clinical Impression:  1. Dementia with agitation, unspecified dementia severity, unspecified dementia type [facility])      Discharge   Final Clinical Impression(s) / ED Diagnoses Final diagnoses:  Dementia with agitation, unspecified dementia severity, unspecified dementia type [facility])    Rx / DC Orders ED Discharge Orders     None         Glyn Ade, MD 05/19/23 2133

## 2023-06-07 ENCOUNTER — Ambulatory Visit: Payer: No Typology Code available for payment source | Admitting: Family

## 2023-07-12 ENCOUNTER — Encounter: Payer: Self-pay | Admitting: Physician Assistant

## 2023-07-18 NOTE — Progress Notes (Incomplete)
Assessment/Plan:     Joe Wise is a very pleasant 68 y.o. year old RH male with a history of hypertension, hyperlipidemia, DM2, adrenal nodule, seen today for evaluation of memory loss. MoCA today is .  Workup is in progress.    Memory Impairment  MRI brain without contrast to assess for underlying structural abnormality and assess vascular load  Neurocognitive testing to further evaluate cognitive concerns and determine other underlying cause of memory changes, including potential contribution from sleep, anxiety, attention, or depression among others  Check B12, TSH Recommend good control of cardiovascular risk factors.   Continue to control mood as per PCP Folllow up in   Subjective:    The patient is accompanied by ***  who supplements the history.    How long did patient have memory difficulties?  For about.  Patient reports some difficulty remembering new information, recent conversations, names. repeats oneself?  Endorsed Disoriented when walking into a room?  Patient denies ***  Leaving objects in unusual places?  denies   Wandering behavior? denies   Any personality changes, or depression, anxiety? denies*** Hallucinations or paranoia? denies   Seizures? denies    Any sleep changes?  Sleeps well ***/does not sleep well. ***vivid dreams, REM behavior or sleepwalking   Sleep apnea? Denies.   Any hygiene concerns?  Denies.   Independent of bathing and dressing? Endorsed  Does the patient need help with medications?  is in charge *** Who is in charge of the finances?  is in charge   *** Any changes in appetite?   Denies. ***   Patient have trouble swallowing?  Denies.   Does the patient cook? No*** Any headaches?  Denies.   Chronic pain? Denies.   Ambulates with difficulty? Denies ***  Needs a cane*** Needs a walker *** to ambulate for stability.   Recent falls or head injuries? Denies.     Vision changes?  Denies any new issues.  Has a history of*** Any  strokelike symptoms? Denies.   Any tremors? Denies. *** Any anosmia? Denies.   Any incontinence of urine? Denies.   Any bowel dysfunction? Denies.      Patient lives with ***  History of heavy alcohol intake? Denies.   History of heavy tobacco use? Denies.   Family history of dementia?   *** with dementia  Does patient drive? No ***  Allergies  Allergen Reactions   Sudafed [Pseudoephedrine]     Current Outpatient Medications  Medication Instructions   amitriptyline (ELAVIL) 25 mg, Oral, At bedtime PRN, for sleep   Basaglar KwikPen 130 Units, Subcutaneous, BH-each morning, And pen needles 1/day   Blood Glucose Monitoring Suppl (ONETOUCH VERIO IQ SYSTEM) w/Device KIT Use to check blood sugar two times per day.   finasteride (PROSCAR) 5 mg, Oral, Daily   glucose blood (ONETOUCH VERIO) test strip Use to check blood sugar two times per day.   halobetasol (ULTRAVATE) 0.05 % cream Topical, 2 times daily   lisinopril-hydrochlorothiazide (PRINZIDE,ZESTORETIC) 20-25 MG tablet 1 tablet, Oral, Daily   loratadine (CLARITIN) 10 mg, Oral, Daily   meclizine (ANTIVERT) 25 mg, Oral, 3 times daily PRN   MICROLET LANCETS MISC Use to check blood sugar 2 times per day. Dx code: E11.9   mometasone (NASONEX) 50 MCG/ACT nasal spray 2 sprays, Nasal, Daily   simvastatin (ZOCOR) 20 mg, Oral, Daily at bedtime   Testosterone (ANDROGEL PUMP TD) Place onto the skin. 1% gel; apply daily     VITALS:  There  were no vitals filed for this visit.    PHYSICAL EXAM   HEENT:  Normocephalic, atraumatic.  The superficial temporal arteries are without ropiness or tenderness. Cardiovascular: Regular rate and rhythm. Lungs: Clear to auscultation bilaterally. Neck: There are no carotid bruits noted bilaterally.  NEUROLOGICAL:     No data to display              No data to display           Orientation:  Alert and oriented to person, place and not to time***. No aphasia or dysarthria. Fund of knowledge is  appropriate. Recent and remote memory impaired.  Attention and concentration are reduced***.  Able to name objects and repeat phrases /5 ***. Delayed recall  / *** Cranial nerves: There is good facial symmetry. Extraocular muscles are intact and visual fields are full to confrontational testing. Speech is fluent and clear. No tongue deviation. Hearing is intact to conversational tone.*** Tone: Tone is good throughout. Sensation: Sensation is intact to light touch.  Vibration is intact at the bilateral big toe.  Coordination: The patient has no difficulty with RAM's or FNF bilaterally. Normal finger to nose  Motor: Strength is 5/5 in the bilateral upper and lower extremities. There is no pronator drift. There are no fasciculations noted. DTR's: Deep tendon reflexes are 2/4 bilaterally. Gait and Station: The patient is able to ambulate without difficulty. Gait is cautious and narrow. Stride length is normal ***      Thank you for allowing [place] the opportunity to participate in the care of this nice patient. Please do not hesitate to contact us for any questions or concerns.   Total time spent on today's visit was *** minutes dedicated to this patient today, preparing to see patient, examining the patient, ordering tests and/or medications and counseling the patient, documenting clinical information in the EHR or other health record, independently interpreting results and communicating results to the patient/family, discussing treatment and goals, answering patient's questions and coordinating care.  Cc:  Sondra Come, MD  Marlowe Kays 07/18/2023 1:20 PM

## 2023-07-21 ENCOUNTER — Ambulatory Visit: Payer: No Typology Code available for payment source | Admitting: Physician Assistant

## 2023-07-21 ENCOUNTER — Ambulatory Visit: Payer: No Typology Code available for payment source

## 2023-08-01 ENCOUNTER — Emergency Department (HOSPITAL_BASED_OUTPATIENT_CLINIC_OR_DEPARTMENT_OTHER)
Admission: EM | Admit: 2023-08-01 | Discharge: 2023-08-01 | Disposition: A | Discharge status: Against Medical Advice | Payer: No Typology Code available for payment source | Attending: Emergency Medicine | Admitting: Emergency Medicine

## 2023-08-01 ENCOUNTER — Encounter (HOSPITAL_BASED_OUTPATIENT_CLINIC_OR_DEPARTMENT_OTHER): Payer: Self-pay | Admitting: Emergency Medicine

## 2023-08-01 ENCOUNTER — Emergency Department (HOSPITAL_BASED_OUTPATIENT_CLINIC_OR_DEPARTMENT_OTHER): Payer: No Typology Code available for payment source

## 2023-08-01 DIAGNOSIS — R55 Syncope and collapse: Secondary | ICD-10-CM | POA: Diagnosis present

## 2023-08-01 DIAGNOSIS — Z5321 Procedure and treatment not carried out due to patient leaving prior to being seen by health care provider: Secondary | ICD-10-CM | POA: Diagnosis not present

## 2023-08-01 DIAGNOSIS — F0390 Unspecified dementia without behavioral disturbance: Secondary | ICD-10-CM | POA: Insufficient documentation

## 2023-08-01 LAB — CBC WITH DIFFERENTIAL/PLATELET
Abs Immature Granulocytes: 0.04 10*3/uL (ref 0.00–0.07)
Basophils Absolute: 0.1 10*3/uL (ref 0.0–0.1)
Basophils Relative: 1 %
Eosinophils Absolute: 0.1 10*3/uL (ref 0.0–0.5)
Eosinophils Relative: 2 %
HCT: 39.1 % (ref 39.0–52.0)
Hemoglobin: 12.6 g/dL — ABNORMAL LOW (ref 13.0–17.0)
Immature Granulocytes: 1 %
Lymphocytes Relative: 34 %
Lymphs Abs: 2.5 10*3/uL (ref 0.7–4.0)
MCH: 28.8 pg (ref 26.0–34.0)
MCHC: 32.2 g/dL (ref 30.0–36.0)
MCV: 89.3 fL (ref 80.0–100.0)
Monocytes Absolute: 0.7 10*3/uL (ref 0.1–1.0)
Monocytes Relative: 10 %
Neutro Abs: 3.9 10*3/uL (ref 1.7–7.7)
Neutrophils Relative %: 52 %
Platelets: 241 10*3/uL (ref 150–400)
RBC: 4.38 MIL/uL (ref 4.22–5.81)
RDW: 13 % (ref 11.5–15.5)
WBC: 7.3 10*3/uL (ref 4.0–10.5)
nRBC: 0 % (ref 0.0–0.2)

## 2023-08-01 LAB — CBG MONITORING, ED: Glucose-Capillary: 158 mg/dL — ABNORMAL HIGH (ref 70–99)

## 2023-08-01 LAB — COMPREHENSIVE METABOLIC PANEL
ALT: 19 U/L (ref 0–44)
AST: 22 U/L (ref 15–41)
Albumin: 4.7 g/dL (ref 3.5–5.0)
Alkaline Phosphatase: 59 U/L (ref 38–126)
Anion gap: 10 (ref 5–15)
BUN: 15 mg/dL (ref 8–23)
CO2: 29 mmol/L (ref 22–32)
Calcium: 9.8 mg/dL (ref 8.9–10.3)
Chloride: 101 mmol/L (ref 98–111)
Creatinine, Ser: 1.86 mg/dL — ABNORMAL HIGH (ref 0.61–1.24)
GFR, Estimated: 39 mL/min — ABNORMAL LOW (ref >60)
Glucose, Bld: 173 mg/dL — ABNORMAL HIGH (ref 70–99)
Potassium: 3.7 mmol/L (ref 3.5–5.1)
Sodium: 140 mmol/L (ref 135–145)
Total Bilirubin: 0.4 mg/dL (ref 0.0–1.2)
Total Protein: 7.4 g/dL (ref 6.5–8.1)

## 2023-08-01 LAB — APTT: aPTT: 28 s (ref 24–36)

## 2023-08-01 LAB — PROTIME-INR
INR: 0.9 (ref 0.8–1.2)
Prothrombin Time: 12.5 s (ref 11.4–15.2)

## 2023-08-01 NOTE — ED Triage Notes (Signed)
 Passed out  Was sitting at a picnic table, passed out Was drinking and smoking marijuana (normal nightly behavior) Less than a minute Dementia baseline, orient to self Vomiting after syncopal episode  Ems VS 139/76 70 16 99%RA Cbg 275  18g LAC  Recently increase amlodipine (last week)

## 2023-08-01 NOTE — ED Provider Triage Note (Signed)
 Emergency Medicine Provider Triage Evaluation Note  Joe Wise , a 68 y.o. male  was evaluated in triage.  Pt complains of syncope.  Review of Systems  Positive:  Negative:   Physical Exam  BP 139/72 (BP Location: Right Arm)   Pulse 71   Temp 98.2 F (36.8 C)   Resp 18   SpO2 100%  Gen:   Awake, no distress   Resp:  Normal effort  MSK:   Moves extremities without difficulty  Other:    Medical Decision Making  Medically screening exam initiated at 6:48 PM.  Appropriate orders placed.  Joe Wise was informed that the remainder of the evaluation will be completed by another provider, this initial triage assessment does not replace that evaluation, and the importance of remaining in the ED until their evaluation is complete.  Drinking ETOH and smoking mariajuana. Had 2 syncopal episodes in a row both lasting <13min. Patient started vomiting after 2nd episode. Vomited around 4 times. Has not vomited since. Patient with dementia and AAOx2 at baseline. No family members currently at bedside. Patient poor historian.    [place] Joe Wise, [place] 08/01/23 1850

## 2023-08-01 NOTE — ED Notes (Signed)
Pt informed registration he was leaving.  

## 2023-08-02 ENCOUNTER — Encounter: Payer: Self-pay | Admitting: Physician Assistant

## 2023-08-02 ENCOUNTER — Ambulatory Visit: Payer: No Typology Code available for payment source

## 2023-08-02 ENCOUNTER — Ambulatory Visit: Payer: No Typology Code available for payment source | Admitting: Physician Assistant

## 2023-08-02 ENCOUNTER — Other Ambulatory Visit: Payer: No Typology Code available for payment source

## 2023-08-02 ENCOUNTER — Other Ambulatory Visit: Payer: Self-pay

## 2023-08-02 VITALS — BP 137/77 | HR 69 | Resp 18 | Wt 163.0 lb

## 2023-08-02 DIAGNOSIS — R413 Other amnesia: Secondary | ICD-10-CM

## 2023-08-02 DIAGNOSIS — G309 Alzheimer's disease, unspecified: Secondary | ICD-10-CM

## 2023-08-02 DIAGNOSIS — F0280 Dementia in other diseases classified elsewhere without behavioral disturbance: Secondary | ICD-10-CM | POA: Diagnosis not present

## 2023-08-02 NOTE — Progress Notes (Signed)
 Assessment/Plan:     Joe Wise is a very pleasant 68 y.o. year old RH male with a history of hypertension, hyperlipidemia, DM2, adrenal nodule, seen today for evaluation of memory loss. MoCA unable to be performed. MMSE 0/30. CT head personally reviewed remarkable for cerebral and hippocampal atrophy, findings consisted for Alzheimer's disease.  Discussed with his son goals of care. Unfortunately, due to advanced disease, no antidementia medication is indicated. Mood is good. He has excellent family support.     Memory Impairment  MRI brain without contrast to assess for underlying structural abnormality and assess vascular load  Check B12, TSH, B1 Lumbar puncture for formal diagnosis  Recommend good control of cardiovascular risk factors.   Continue to control mood as per PCP [place] up in 1 months to discuss results Agree with social work involvement  Subjective:    The patient is accompanied by his son who supplements the history.    How long did patient have memory difficulties? His son reports  Since April 2024, when he forgot to eat I became worried, but he may have shown signs before that.  Since that time, he began to forget more often new information, recent conversations, names. He even forgets his own family members' names, but can recognize faces.  repeats oneself?  Endorsed, frequently, and also repeats same phrases.  Disoriented when walking into a room?  Patient denies    Leaving objects in unusual places? Endorsed, for example toothpaste in the freezer.    Wandering behavior? He drove to [place], run the car out of gas and got off, started walking, and EMS was able to find his phone, but  he was unable to tell where he was.   Any personality changes, or depression, anxiety? He is ok . Son denies any major personality changes Hallucinations or paranoia? Denies.   Seizures? Denies.    Any sleep changes?  Does not sleep well.  He watches Westerns most of  the night.  He reports vivid dreams, no REM behavior or sleepwalking   Sleep apnea? After adenoid removal he does not have sleep apnea Any hygiene concerns?  Denies.   Independent of bathing and dressing? Endorsed  Does the patient need help with medications? Son  is in charge   Who is in charge of the finances? Daughter  is in charge     Any changes in appetite?  Improved some, but sometimes he needs to be reminded. Not drinking enough water.     Patient have trouble swallowing?  Denies.   Does the patient cook? No  Any headaches?  Denies.   Chronic pain? Denies.   Ambulates with difficulty? Denies  Recent falls or head injuries? 08/01/23, he had a mechanical fall, no LOC, no head trauma Vision changes?  Denies any new issues.    Any strokelike symptoms? Denies.   Any tremors? Denies.   Any anosmia? Denies.   Any incontinence of urine? Denies.   Any bowel dysfunction? Denies.      Patient lives with his daughter    History of heavy alcohol intake? 3 beers a day, on occasion more. He likes Bourbon and Vodka  History of heavy tobacco use? Endorsed, 2-3 cig a day.   Family history of dementia?   His mother had AD   Does patient drive? 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   finasteride  (PROSCAR ) 5 mg, Oral, Daily   glucose blood (ONETOUCH VERIO) test strip Use to check blood sugar two times per day.   halobetasol  (ULTRAVATE ) 0.05 % cream Topical, 2 times daily   lisinopril -hydrochlorothiazide   (PRINZIDE ,ZESTORETIC ) 20-25 MG tablet 1 tablet, Oral, Daily   loratadine  (CLARITIN ) 10 mg, Oral, Daily   meclizine  (ANTIVERT ) 25 mg, Oral, 3 times daily PRN   MICROLET LANCETS MISC Use to check blood sugar 2 times per day. Dx code: E11.9   mometasone  (NASONEX ) 50 MCG/ACT nasal spray 2 sprays, Nasal, Daily   simvastatin  (ZOCOR ) 20 mg, Oral, Daily at bedtime   Testosterone  (ANDROGEL  PUMP TD) Place onto the skin. 1% gel; apply daily     VITALS:   Vitals:   08/02/23 0750  BP: 137/77  Pulse: 69  Resp: 18  SpO2: 99%  Weight: 163 lb (73.9 kg)      PHYSICAL EXAM   HEENT:  Normocephalic, atraumatic.  The superficial temporal arteries are without ropiness or tenderness. Cardiovascular: Regular rate and rhythm. Lungs: Clear to auscultation bilaterally. Neck: There are no carotid bruits noted bilaterally.  NEUROLOGICAL:     No data to display             08/02/2023    8:00 AM  MMSE - Mini Mental State Exam  Orientation to time 0  Orientation to Place 0  Registration 0  Attention/ Calculation 0  Recall 0  Language- name 2 objects 0  Language- repeat 0  Language- follow 3 step command 0  Language- read & follow direction 0  Write a sentence 0  Write a sentence-comments did not understand command  Copy design 0  Copy design-comments did not comprehend command  Total score 0     Orientation:  Alert and not oriented to person, place or  time . No aphasia or dysarthria. Fund of knowledge is reduced. Recent and remote memory impaired.  Attention and concentration are reduced.  Unable to name objects and repeat phrases  Delayed recall 0 Cranial nerves: There is good facial symmetry. Extraocular muscles are intact and visual fields are full to confrontational testing. Speech is fluent and clear. No tongue deviation. Hearing is intact to conversational tone.  Tone: Tone is good throughout. Sensation: Sensation is intact to light touch.  Vibration is intact at the bilateral big toe.   Coordination: The patient has no difficulty with RAM's or FNF bilaterally. Normal finger to nose  Motor: Strength is 5/5 in the bilateral upper and lower extremities. There is no pronator drift. There are no fasciculations noted. DTR's: Deep tendon reflexes are 2/4 bilaterally. Gait and Station: The patient is able to ambulate without difficulty. Gait is cautious and narrow. Stride length is normal        Thank you for allowing us  the opportunity to participate in the care of this nice patient. Please do not hesitate to contact us  for any questions or concerns.   Total time spent on today's visit was 45 minutes dedicated to this patient today, preparing to see patient, examining the patient, ordering tests and/or medications and counseling the patient, documenting clinical information in  the EHR or other health record, independently interpreting results and communicating results to the patient/family, discussing treatment and goals, answering patient's questions and coordinating care.  Cc:  Milissa Savant, MD  Camie Sevin 08/02/2023 8:49 AM

## 2023-08-02 NOTE — Patient Instructions (Signed)
 Labs today [address] Mri at [facility] [phone] Lumbar Spinal tap at [facility] [phone]

## 2023-08-08 LAB — VITAMIN B1: Vitamin B1 (Thiamine): 9 nmol/L (ref 8–30)

## 2023-08-08 LAB — VITAMIN B12: Vitamin B-12: 366 pg/mL (ref 200–1100)

## 2023-08-08 LAB — TSH: TSH: 2.17 m[IU]/L (ref 0.40–4.50)

## 2023-08-08 NOTE — Progress Notes (Signed)
vitamin B12 is low.  Recommend starting on vitamin B12 1000 mcg daily.  B1 is low, take 100 mg daily. Follow-up with PCP. Thyroid normal   Thank you

## 2023-08-09 ENCOUNTER — Ambulatory Visit
Admission: RE | Admit: 2023-08-09 | Discharge: 2023-08-09 | Disposition: A | Discharge status: Home / Self Care | Payer: No Typology Code available for payment source | Source: Ambulatory Visit | Attending: Physician Assistant | Admitting: Physician Assistant

## 2023-08-28 NOTE — Discharge Instructions (Signed)

## 2023-08-29 ENCOUNTER — Inpatient Hospital Stay
Admission: RE | Admit: 2023-08-29 | Discharge: 2023-08-29 | Disposition: A | Discharge status: Home / Self Care | Payer: No Typology Code available for payment source | Source: Ambulatory Visit | Attending: Physician Assistant | Admitting: Physician Assistant

## 2023-09-05 NOTE — Discharge Instructions (Signed)

## 2023-09-06 ENCOUNTER — Inpatient Hospital Stay
Admission: RE | Admit: 2023-09-06 | Discharge: 2023-09-06 | Disposition: A | Discharge status: Home / Self Care | Source: Ambulatory Visit | Attending: Physician Assistant | Admitting: Physician Assistant

## 2023-09-13 NOTE — Discharge Instructions (Signed)

## 2023-09-14 ENCOUNTER — Ambulatory Visit
Admission: RE | Admit: 2023-09-14 | Discharge: 2023-09-14 | Disposition: A | Discharge status: Home / Self Care | Source: Ambulatory Visit | Attending: Physician Assistant | Admitting: Physician Assistant

## 2023-09-14 ENCOUNTER — Other Ambulatory Visit (HOSPITAL_COMMUNITY)
Admission: RE | Admit: 2023-09-14 | Discharge: 2023-09-14 | Disposition: A | Discharge status: Home / Self Care | Source: Ambulatory Visit | Attending: Physician Assistant | Admitting: Physician Assistant

## 2023-09-14 DIAGNOSIS — R413 Other amnesia: Secondary | ICD-10-CM

## 2023-09-14 NOTE — Progress Notes (Signed)
1 vial of blood drawn from pts Left AC to be sent off with LP lab work. 1 successful attempt, pt tolerated well. Gauze and tape applied after.

## 2023-09-15 ENCOUNTER — Ambulatory Visit: Payer: No Typology Code available for payment source | Admitting: Physician Assistant

## 2023-09-15 LAB — CYTOLOGY - NON PAP

## 2023-10-03 LAB — MAYO MISC ORDER
Miscellaneous Test Results: NEGATIVE
NORMAL RANGE:: NEGATIVE
PRICE:: 125
PRICE:: 1740.8

## 2023-10-03 LAB — MAYO MISC ORDER 2: PRICE:: 1288

## 2023-10-06 ENCOUNTER — Ambulatory Visit: Admitting: Physician Assistant

## 2023-10-15 LAB — PROTEIN, CSF 14-3-3 (PRION DISEASE)
14-3-3 PROTEIN (CSF)++: 9369 [AU]/ml — ABNORMAL HIGH (ref 173–1999)
EST PROB PRION DIS IN PATIENT: <1 %
RT-QUIC (CSF)*: NEGATIVE
T-TAU PROTEIN (CSF)++: 718 pg/mL (ref 0–1149)

## 2023-10-15 LAB — CRYPTOCOCCAL AG, LTX SCR RFLX TITER
Cryptococcal Ag Screen: NOT DETECTED
MICRO NUMBER:: 16226155
SPECIMEN QUALITY:: ADEQUATE

## 2023-10-15 LAB — CSF CULTURE W GRAM STAIN
MICRO NUMBER:: 16226157
Result:: NO GROWTH
SPECIMEN QUALITY:: ADEQUATE

## 2023-10-15 LAB — EXTRA SPECIMEN

## 2023-10-15 LAB — HERPES SIMPLEX VIRUS, TYPE 1 AND 2 DNA,QUAL,RT PCR
HSV 1 DNA: NOT DETECTED
HSV 2 DNA: NOT DETECTED

## 2023-10-15 LAB — CNS IGG SYNTHESIS RATE, CSF+BLOOD
Albumin Serum: 4.6 g/dL (ref 3.6–5.1)
Albumin, CSF: 18 mg/dL (ref 8.0–42.0)
CNS-IgG Synthesis Rate: -2 mg/(24.h) (ref <=3.3)
IgG (Immunoglobin G), Serum: 870 mg/dL (ref 600–1540)
IgG Total CSF: 1.8 mg/dL (ref 0.8–7.7)
IgG-Index: 0.53 (ref <0.70)

## 2023-10-15 LAB — FUNGUS CULTURE W SMEAR
CULTURE:: NO GROWTH
MICRO NUMBER:: 16226156
SMEAR:: NONE SEEN
SPECIMEN QUALITY:: ADEQUATE

## 2023-10-15 LAB — CSF CELL COUNT WITH DIFFERENTIAL
RBC Count, CSF: 0 {cells}/uL
TOTAL NUCLEATED CELL: 1 {cells}/uL (ref 0–5)

## 2023-10-15 LAB — MYELIN BASIC PROTEIN, CSF: Myelin Basic Protein: <2 ug/L (ref <=4.0)

## 2023-10-15 LAB — ANGIOTENSIN CONVERTING ENZYME, CSF: ANGIOTENSIN CONVERTING ENZYME ( ACE) CSF: 6 U/L (ref <=15)

## 2023-10-15 LAB — VDRL, CSF: VDRL Quant, CSF: NONREACTIVE

## 2023-10-15 LAB — PROTEIN, CSF: Total Protein, CSF: 36 mg/dL (ref 15–60)

## 2023-10-15 LAB — GLUCOSE, CSF: Glucose, CSF: 129 mg/dL — ABNORMAL HIGH (ref 40–80)

## 2023-10-15 LAB — OLIGOCLONAL BANDS, CSF + SERM: Oligo Bands: ABSENT

## 2023-11-03 ENCOUNTER — Encounter: Payer: Self-pay | Admitting: Physician Assistant

## 2023-11-03 ENCOUNTER — Ambulatory Visit: Admitting: Physician Assistant

## 2023-11-03 VITALS — BP 123/74 | HR 79 | Resp 20 | Wt 165.0 lb

## 2023-11-03 DIAGNOSIS — G3109 Other frontotemporal dementia: Secondary | ICD-10-CM

## 2023-11-03 DIAGNOSIS — F0280 Dementia in other diseases classified elsewhere without behavioral disturbance: Secondary | ICD-10-CM

## 2023-11-03 HISTORY — DX: Dementia in other diseases classified elsewhere, unspecified severity, without behavioral disturbance, psychotic disturbance, mood disturbance, and anxiety: F02.80

## 2023-11-03 NOTE — Progress Notes (Signed)
 Assessment/Plan:    Frontotemporal dementia  Joe Wise is a very pleasant 68 y.o. RH malehypertension, hyperlipidemia, DM2, adrenal nodule, seen today for evaluation of memory loss. MoCA unable to be performed. MMSE 0/30. CT head personally reviewed remarkable for cerebral and hippocampal atrophy seen today in follow up to discuss the MRI of the brain results. These were personally reviewed, remarkable for frontotemporal atrophy, advanced and asymmetrical, severe on the left and is severe in the left hemisphere.   Given these results, and LP negative for Alzheimer's disease, there is clear etiology of his neurodegenerative disease as  frontotemporal dementia, advanced.  Per latest literature, there is no indication for antidementia medication for the treatment of FTD as it may exacerbate the FTD symptoms.  Recommend that if any behavioral changes should occur, mood needs to be treated with medication such as SSRIs such as Zoloft, Lexapro and Celexa etc., antipsychotic, such as quetiapine or olanzapine for behavioral symptoms. Currently family denies any of these symptoms but they will contact as if medication is needed, This patient is accompanied in the office by his son  and his daughter. All questions were answered to their satisfaction.   Follow up in 6 months. Recommend good control of cardiovascular risk factors Continue to control mood   No indication for antidementia medication given the diagnosis Recommend social work involvement for goals of care     Initial visit 08/02/23 How long did patient have memory difficulties? His son reports  "Since April 2024, when he forgot to eat I became worried, but he may have shown signs before that".  Since that time, he began to forget more often new information, recent conversations, names. He even forgets his own family members' names, but can recognize faces.  repeats oneself?  Endorsed, frequently, and also repeats same phrases.   Disoriented when walking into a room?  Patient denies    Leaving objects in unusual places? Endorsed, for example toothpaste in the freezer.    Wandering behavior? He drove to [place], run the car out of gas and got off, started walking, and EMS was able to find his phone, but  he was unable to tell where he was.   Any personality changes, or depression, anxiety? "He is ok" . Son denies any major personality changes Hallucinations or paranoia? Denies.   Seizures? Denies.    Any sleep changes?  Does not sleep well.  He watches Western[employer] most of the night.  He reports vivid dreams, no REM behavior or sleepwalking   Sleep apnea? After adenoid removal he does not have sleep apnea Any hygiene concerns?  Denies.   Independent of bathing and dressing? Endorsed  Does the patient need help with medications? Son  is in charge   Who is in charge of the finances? Daughter  is in charge     Any changes in appetite?  "Improved some", but sometimes he needs to be reminded. Not drinking enough water.     Patient have trouble swallowing?  Denies.   Does the patient cook? No  Any headaches?  Denies.   Chronic pain? Denies.   Ambulates with difficulty? Denies  Recent falls or head injuries? 08/01/23, he had a mechanical fall, no LOC, no head trauma Vision changes?  Denies any new issues.    Any strokelike symptoms? Denies.   Any tremors? Denies.   Any anosmia? Denies.   Any incontinence of urine? Denies.   Any bowel dysfunction? Denies.      Patient lives  with his daughter    History of heavy alcohol intake? 3 beers a day, on occasion more. He likes Bourbon and Vodka  History of heavy tobacco use? Endorsed, 2-3 cig a day.   Family history of dementia?   His mother had AD   Does patient drive? 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 for sleep   Blood Glucose Monitoring Suppl (ONETOUCH VERIO IQ SYSTEM) w/Device KIT Use to check blood sugar two times per day.   finasteride  (PROSCAR ) 5 MG tablet Take 1 tablet (5 mg total) by mouth daily.   glucose blood (ONETOUCH VERIO) test strip Use to check blood sugar two times per day.   halobetasol  (ULTRAVATE ) 0.05 % cream APPLY TOPICALLY 2 (TWO) TIMES DAILY.   Insulin  Glargine (BASAGLAR  KWIKPEN) 100 UNIT/ML SOPN Inject 1.3 mLs (130 Units total) into the skin every morning. And pen needles 1/day   lisinopril -hydrochlorothiazide  (PRINZIDE ,ZESTORETIC ) 20-25 MG tablet Take 1 tablet by mouth daily.   loratadine  (CLARITIN ) 10 MG tablet Take 1 tablet (10 mg total) by mouth daily.   meclizine  (ANTIVERT ) 25 MG tablet Take 1 tablet (25 mg total) by mouth 3 (three) times daily as needed for dizziness.   MICROLET LANCETS MISC Use to check blood sugar 2 times per day. Dx code: E11.9   mometasone  (NASONEX ) 50 MCG/ACT nasal spray Place 2 sprays into the nose daily.   simvastatin  (ZOCOR ) 20 MG tablet Take 1 tablet (20 mg total) by mouth at bedtime.   Testosterone  (ANDROGEL  PUMP TD) Place onto the skin. 1% gel; apply daily   No facility-administered encounter medications on file as of 11/03/2023.       08/02/2023    8:00 AM  MMSE - Mini Mental State Exam  Orientation to time 0  Orientation to Place 0  Registration 0  Attention/ Calculation 0  Recall 0  Language- name 2 objects 0  Language- repeat 0  Language- follow 3 step command 0  Language- read & follow direction 0  Write a sentence 0  Write a sentence-comments did not understand command  Copy design 0  Copy design-comments did not comprehend command  Total score 0       No data to display         Thank you for allowing us  the opportunity to participate in the care of this nice patient. Please do not hesitate  to contact us  for any questions or concerns.   Total time spent on today's visit was 33 minutes dedicated to this patient today, preparing to see patient, examining the patient, ordering tests and/or medications and counseling the patient, documenting clinical information in the EHR or other health record, independently interpreting results and communicating results to the patient/family, discussing treatment and goals, answering patient's questions and coordinating care.  Cc:  Kinnie Penton, MD  Tex Filbert 11/03/2023 6:08 AM

## 2023-11-03 NOTE — Patient Instructions (Signed)
 Follow up in 6 months

## 2023-11-10 ENCOUNTER — Emergency Department (HOSPITAL_COMMUNITY)

## 2023-11-10 ENCOUNTER — Other Ambulatory Visit: Payer: Self-pay

## 2023-11-10 ENCOUNTER — Emergency Department (HOSPITAL_COMMUNITY)
Admission: EM | Admit: 2023-11-10 | Discharge: 2023-11-11 | Disposition: A | Discharge status: Home / Self Care | Attending: Emergency Medicine | Admitting: Emergency Medicine

## 2023-11-10 ENCOUNTER — Encounter (HOSPITAL_COMMUNITY): Payer: Self-pay

## 2023-11-10 DIAGNOSIS — Z79899 Other long term (current) drug therapy: Secondary | ICD-10-CM | POA: Diagnosis not present

## 2023-11-10 DIAGNOSIS — E876 Hypokalemia: Secondary | ICD-10-CM | POA: Diagnosis not present

## 2023-11-10 DIAGNOSIS — F0390 Unspecified dementia without behavioral disturbance: Secondary | ICD-10-CM | POA: Diagnosis not present

## 2023-11-10 DIAGNOSIS — R739 Hyperglycemia, unspecified: Secondary | ICD-10-CM | POA: Insufficient documentation

## 2023-11-10 DIAGNOSIS — R55 Syncope and collapse: Secondary | ICD-10-CM | POA: Diagnosis present

## 2023-11-10 DIAGNOSIS — I10 Essential (primary) hypertension: Secondary | ICD-10-CM | POA: Diagnosis not present

## 2023-11-10 DIAGNOSIS — R001 Bradycardia, unspecified: Secondary | ICD-10-CM | POA: Diagnosis not present

## 2023-11-10 LAB — MAGNESIUM: Magnesium: 2.3 mg/dL (ref 1.7–2.4)

## 2023-11-10 LAB — COMPREHENSIVE METABOLIC PANEL WITH GFR
ALT: 26 U/L (ref 0–44)
AST: 25 U/L (ref 15–41)
Albumin: 4 g/dL (ref 3.5–5.0)
Alkaline Phosphatase: 60 U/L (ref 38–126)
Anion gap: 8 (ref 5–15)
BUN: 9 mg/dL (ref 8–23)
CO2: 29 mmol/L (ref 22–32)
Calcium: 9.1 mg/dL (ref 8.9–10.3)
Chloride: 100 mmol/L (ref 98–111)
Creatinine, Ser: 1.31 mg/dL — ABNORMAL HIGH (ref 0.61–1.24)
GFR, Estimated: 59 mL/min — ABNORMAL LOW (ref >60)
Glucose, Bld: 232 mg/dL — ABNORMAL HIGH (ref 70–99)
Potassium: 3.1 mmol/L — ABNORMAL LOW (ref 3.5–5.1)
Sodium: 137 mmol/L (ref 135–145)
Total Bilirubin: 0.7 mg/dL (ref 0.0–1.2)
Total Protein: 6.6 g/dL (ref 6.5–8.1)

## 2023-11-10 LAB — CBC
HCT: 35.1 % — ABNORMAL LOW (ref 39.0–52.0)
Hemoglobin: 11.3 g/dL — ABNORMAL LOW (ref 13.0–17.0)
MCH: 29.1 pg (ref 26.0–34.0)
MCHC: 32.2 g/dL (ref 30.0–36.0)
MCV: 90.5 fL (ref 80.0–100.0)
Platelets: 215 10*3/uL (ref 150–400)
RBC: 3.88 MIL/uL — ABNORMAL LOW (ref 4.22–5.81)
RDW: 13.6 % (ref 11.5–15.5)
WBC: 6.1 10*3/uL (ref 4.0–10.5)
nRBC: 0 % (ref 0.0–0.2)

## 2023-11-10 LAB — URINALYSIS, ROUTINE W REFLEX MICROSCOPIC
Bacteria, UA: NONE SEEN
Bilirubin Urine: NEGATIVE
Glucose, UA: >=500 mg/dL — AB
Hgb urine dipstick: NEGATIVE
Ketones, ur: NEGATIVE mg/dL
Leukocytes,Ua: NEGATIVE
Nitrite: NEGATIVE
Protein, ur: NEGATIVE mg/dL
Specific Gravity, Urine: 1.028 (ref 1.005–1.030)
pH: 5 (ref 5.0–8.0)

## 2023-11-10 LAB — TROPONIN I (HIGH SENSITIVITY): Troponin I (High Sensitivity): 5 ng/L (ref <18)

## 2023-11-10 LAB — CBG MONITORING, ED: Glucose-Capillary: 226 mg/dL — ABNORMAL HIGH (ref 70–99)

## 2023-11-10 MED ORDER — POTASSIUM CHLORIDE 10 MEQ/100ML IV SOLN: 10.0000 meq | Freq: Once | INTRAVENOUS | Status: DC

## 2023-11-10 MED ORDER — SODIUM CHLORIDE 0.9 % IV BOLUS
1000.0000 mL | Freq: Once | INTRAVENOUS | Status: AC
  Administered 2023-11-10: 1000 mL via INTRAVENOUS

## 2023-11-10 MED ORDER — POTASSIUM CHLORIDE 10 MEQ/100ML IV SOLN
10.0000 meq | INTRAVENOUS | Status: DC
  Administered 2023-11-10: 10 meq via INTRAVENOUS
  Filled 2023-11-10: qty 100

## 2023-11-10 MED ORDER — POTASSIUM CHLORIDE CRYS ER 20 MEQ PO TBCR
40.0000 meq | EXTENDED_RELEASE_TABLET | Freq: Once | ORAL | Status: AC
  Administered 2023-11-10: 40 meq via ORAL
  Filled 2023-11-10: qty 2

## 2023-11-10 MED ORDER — POTASSIUM CHLORIDE ER 10 MEQ PO TBCR: 10.0000 meq | EXTENDED_RELEASE_TABLET | Freq: Every day | ORAL | 0 refills | Status: AC

## 2023-11-10 NOTE — Discharge Instructions (Addendum)
 You will need to follow-up with a specialist, or heart doctor for this episode today.  I placed a referral into their system.  If you do not hear from their scheduling office in 3 business days, call the number above for [facility].

## 2023-11-10 NOTE — ED Triage Notes (Signed)
 Pt BIB [facility]. Pt with a hx of HTN and dementia reportedly had 2 syncopal episodes this evening. Pt had similar event 2 months ago from dehydration. Pt was outside working in the yard. Pt's family helped to ensure pt was drinking water while working outside today. Pt recently had his amlodipine increased from 5 mg to 10 mg one month ago.    EMS Vitals  98/60 increased to 130/68 HR 50 with a 1st degree block.  CBG  270 SpO2 96% on R/A.

## 2023-11-10 NOTE — ED Notes (Signed)
 Provider discontinued second run of potassium

## 2023-11-10 NOTE — ED Provider Notes (Signed)
 [place] EMERGENCY DEPARTMENT AT [facility] Provider Note   CSN: [id number] Arrival date & time: 11/10/23  2117     History  Chief Complaint  Patient presents with   Loss of Consciousness    Joe Wise is a 68 y.o. male with a history of presenting to the ED with concern for loss of consciousness.  Supplement history is provided by EMS as the patient is a level 5 caveat for dementia.  The patient's son at bedside reports that he had been outside doing yard work with his father all day.  The patient had been doing very minimal exertional yard work and been keeping hydrated.  He had 2 episodes where he was complaining of lightheadedness, and his son had to help him down, although the patient did fall and hit his head initially.  These episodes occurred about 7 minutes apart.  There was no loss of consciousness.  No seizure activity or shaking.  His son reports the patient has had similar episodes in the past, although they attributed that to dehydration.  His son also reports the patient recently had an increase of his amlodipine from 5 to 10 mg for high blood pressure.  EMS reports the patient's sugar was 270, he had a heart rate of 50 beats with first-degree heart block, blood pressure initially 98/60 improving without intervention.  Patient reports to me he does feel "a little lightheaded".  He denies chest pain or difficulty breathing.  HPI     Home Medications Prior to Admission medications   Medication Sig Start Date End Date Taking? Authorizing Provider  potassium chloride (KLOR-CON) 10 MEQ tablet Take 1 tablet (10 mEq total) by mouth daily for 15 days. 11/10/23 11/25/23 Yes Rudra Hobbins, Janalyn Me, MD  amitriptyline  (ELAVIL ) 25 MG tablet Take 1 tablet (25 mg total) by mouth at bedtime as needed. for sleep 03/16/17   Plotnikov, Oakley Bellman, MD  Blood Glucose Monitoring Suppl (ONETOUCH VERIO IQ SYSTEM) w/Device KIT Use to check blood sugar two times per day. 07/04/16    Gwyndolyn Lerner, MD  finasteride  (PROSCAR ) 5 MG tablet Take 1 tablet (5 mg total) by mouth daily. 03/16/17   Plotnikov, Aleksei V, MD  glucose blood (ONETOUCH VERIO) test strip Use to check blood sugar two times per day. 07/04/16   Gwyndolyn Lerner, MD  halobetasol  (ULTRAVATE ) 0.05 % cream APPLY TOPICALLY 2 (TWO) TIMES DAILY. 09/19/16   Plotnikov, Oakley Bellman, MD  Insulin  Glargine (BASAGLAR  KWIKPEN) 100 UNIT/ML SOPN Inject 1.3 mLs (130 Units total) into the skin every morning. And pen needles 1/day 05/22/17   Gwyndolyn Lerner, MD  lisinopril -hydrochlorothiazide  (PRINZIDE ,ZESTORETIC ) 20-25 MG tablet Take 1 tablet by mouth daily. 03/16/17   Plotnikov, Oakley Bellman, MD  loratadine  (CLARITIN ) 10 MG tablet Take 1 tablet (10 mg total) by mouth daily. 10/28/15   Plotnikov, Oakley Bellman, MD  meclizine  (ANTIVERT ) 25 MG tablet Take 1 tablet (25 mg total) by mouth 3 (three) times daily as needed for dizziness. 01/14/17   Deatra Face, MD  MICROLET LANCETS MISC Use to check blood sugar 2 times per day. Dx code: E11.9 08/29/16   Gwyndolyn Lerner, MD  mometasone  (NASONEX ) 50 MCG/ACT nasal spray Place 2 sprays into the nose daily. 10/28/15   Plotnikov, Oakley Bellman, MD  simvastatin  (ZOCOR ) 20 MG tablet Take 1 tablet (20 mg total) by mouth at bedtime. 03/16/17   Plotnikov, Oakley Bellman, MD  Testosterone  (ANDROGEL  PUMP TD) Place onto the skin. 1% gel; apply daily    [provider]      Allergies    Sudafed [pseudoephedrine]    Review of Systems   Review of Systems  Physical Exam Updated Vital Signs BP 136/82   Pulse 68   Temp 98.2 F (36.8 C) (Oral)   Resp 12   Ht 5\' 11"  (1.803 m)   Wt 75 kg   SpO2 100%   BMI 23.06 kg/m  Physical Exam Constitutional:      General: He is not in acute distress. HENT:     Head: Normocephalic and atraumatic.  Eyes:     Conjunctiva/sclera: Conjunctivae normal.     Pupils: Pupils are equal, round, and reactive to light.  Cardiovascular:     Rate and Rhythm: Regular rhythm. Bradycardia  present.  Pulmonary:     Effort: Pulmonary effort is normal. No respiratory distress.  Abdominal:     General: There is no distension.     Tenderness: There is no abdominal tenderness.  Skin:    General: Skin is warm and dry.  Neurological:     General: No focal deficit present.     Mental Status: He is alert and oriented to person, place, and time. Mental status is at baseline.     ED Results / Procedures / Treatments   Labs (all labs ordered are listed, but only abnormal results are displayed) Labs Reviewed  COMPREHENSIVE METABOLIC PANEL WITH GFR - Abnormal; Notable for the following components:      Result Value   Potassium 3.1 (*)    Glucose, Bld 232 (*)    Creatinine, Ser 1.31 (*)    GFR, Estimated 59 (*)    All other components within normal limits  CBC - Abnormal; Notable for the following components:   RBC 3.88 (*)    Hemoglobin 11.3 (*)    HCT 35.1 (*)    All other components within normal limits  URINALYSIS, ROUTINE W REFLEX MICROSCOPIC - Abnormal; Notable for the following components:   Glucose, UA >=500 (*)    All other components within normal limits  CBG MONITORING, ED - Abnormal; Notable for the following components:   Glucose-Capillary 226 (*)    All other components within normal limits  MAGNESIUM  TROPONIN I (HIGH SENSITIVITY)    EKG EKG Interpretation Date/Time:  Friday Nov 10 2023 21:36:34 EDT Ventricular Rate:  59 PR Interval:  231 QRS Duration:  133 QT Interval:  434 QTC Calculation: 430 R Axis:   64  Text Interpretation: Sinus or ectopic atrial rhythm Prolonged PR interval Probable left atrial enlargement Right bundle branch block LVH with secondary repolarization abnormality Confirmed by Jerald Molly [phone]) on 11/10/2023 9:41:31 PM  Radiology DG Chest 2 View Result Date: 11/10/2023 CLINICAL DATA:  near syncope EXAM: CHEST - 2 VIEW COMPARISON:  None Available. FINDINGS: Cardiac silhouette is unremarkable. No pneumothorax or pleural  effusion. The lungs are clear. Aorta is calcified. The visualized skeletal structures are unremarkable. IMPRESSION: No acute cardiopulmonary process. Electronically Signed   By: Sydell Eva M.D.   On: 11/10/2023 22:47   CT Head Wo Contrast Result Date: 11/10/2023 CLINICAL DATA:  Trauma EXAM: CT HEAD WITHOUT CONTRAST TECHNIQUE: Contiguous axial images were obtained from the base of the skull through the vertex without intravenous contrast. RADIATION DOSE REDUCTION: This exam was performed according to the departmental dose-optimization program which includes automated exposure control, adjustment of the mA and/or kV according to patient size and/or use of iterative reconstruction technique. COMPARISON:  Head CT 08/01/2023 FINDINGS: Brain: No evidence  of acute infarction, hemorrhage, hydrocephalus, extra-axial collection or mass lesion/mass effect. Again seen is mild diffuse atrophy. Vascular: No hyperdense vessel or unexpected calcification. Skull: Normal. Negative for fracture or focal lesion. Sinuses/Orbits: No acute finding. Other: None. IMPRESSION: 1. No acute intracranial process. 2. Mild diffuse atrophy. Electronically Signed   By: Tyron Gallon M.D.   On: 11/10/2023 22:45    Procedures Procedures    Medications Ordered in ED Medications  potassium chloride 10 mEq in 100 mL IVPB (10 mEq Intravenous New Bag/Given 11/10/23 2254)  sodium chloride  0.9 % bolus 1,000 mL (1,000 mLs Intravenous New Bag/Given 11/10/23 2253)  potassium chloride SA (KLOR-CON M) CR tablet 40 mEq (40 mEq Oral Given 11/10/23 2254)    ED Course/ Medical Decision Making/ A&P Clinical Course as of 11/10/23 2318  Fri Nov 10, 2023  2317 Patient reassessed, feeling well.  Family is at bedside, son and daughter.  We discussed the option of observation in the hospital for near syncope versus close outpatient follow-up with cardiology and they prefer to do this as an outpatient, as a hospital can be disorienting environment for  the patient.  I think this is reasonable with his workup looking otherwise normal.  He is stable for discharge [MT]    Clinical Course User Index [MT] Maricela Kawahara, Janalyn Me, MD                                 Medical Decision Making Amount and/or Complexity of Data Reviewed Labs: ordered. Radiology: ordered.  Risk Prescription drug management.   This patient presents to the ED with concern for episodic lightheadedness, near syncope. This involves an extensive number of treatment options, and is a complaint that carries with it a high risk of complications and morbidity.  The differential diagnosis includes dehydration versus orthostatic hypotension versus vasovagal syncope versus arrhythmia versus symptomatic bradycardia versus anemia versus dehydration versus other  Additional history obtained from EMS and patient's son at the bedside  External records from outside source obtained and reviewed including prior ED visit 11/24 and outpatient records - appears patient has borderline bradycardia at baseline, HR low 60's in ED 2024 and ED visit July 2018  I ordered and personally interpreted labs.  The pertinent results include: Very mild persistent anemia, no significant changes.  UA without evidence of infection.  CMP with potassium 3.1, creatinine 1.3.  Glucose mildly elevated and hyperglycemic in the 230s.  White blood cell count normal.  I ordered imaging studies including CT head, dg chest I independently visualized and interpreted imaging which showed no emergent findings I agree with the radiologist interpretation  The patient was maintained on a cardiac monitor.  I personally viewed and interpreted the cardiac monitored which showed an underlying rhythm of: NSR and sinus bradycardia (HR 50-60's)  Per my interpretation the patient's ECG shows sinus rhythm, mildly prolonged PR interval  I have reviewed the patients home medicines and have made adjustments as needed  Test Considered:  low suspicion for acute PE, ACS, meningitis, CVA  No prominent cardiac murmur on exam to suggest severe aortic stenosis.  Orthostatic vital signs normal - no sig change in BP or HR.    Disposition:  After consideration of the diagnostic results and the patients response to treatment, I feel that the patient would benefit from close outpatient follow up.         Final Clinical Impression(s) / ED Diagnoses Final diagnoses:  Near syncope  Hypokalemia    Rx / DC Orders ED Discharge Orders          Ordered    Ambulatory referral to Cardiology       Comments: Near syncope   11/10/23 2309    potassium chloride (KLOR-CON) 10 MEQ tablet  Daily        11/10/23 2310              Arvilla Birmingham, MD 11/10/23 2318

## 2023-11-28 DIAGNOSIS — I10 Essential (primary) hypertension: Secondary | ICD-10-CM | POA: Insufficient documentation

## 2023-11-28 DIAGNOSIS — N400 Enlarged prostate without lower urinary tract symptoms: Secondary | ICD-10-CM | POA: Insufficient documentation

## 2023-11-28 DIAGNOSIS — E785 Hyperlipidemia, unspecified: Secondary | ICD-10-CM | POA: Insufficient documentation

## 2023-11-29 ENCOUNTER — Ambulatory Visit

## 2023-12-14 ENCOUNTER — Ambulatory Visit

## 2023-12-27 ENCOUNTER — Ambulatory Visit

## 2024-01-11 ENCOUNTER — Ambulatory Visit

## 2024-01-23 ENCOUNTER — Ambulatory Visit: Payer: Self-pay | Admitting: Physician Assistant

## 2024-02-19 ENCOUNTER — Ambulatory Visit: Payer: Self-pay | Admitting: Nurse Practitioner

## 2024-02-19 NOTE — Progress Notes (Deleted)
 Office Visit    Patient Name: Joe Wise Date of Encounter: 02/19/2024  Primary Care Provider:  Milissa Savant, MD Primary Cardiologist:  None  Chief Complaint    68 year old male with a history of syncope, first-degree AV block, hypertension, hyperlipidemia, type 2 diabetes, BPH, and dementia who presents for heart first clinic new patient evaluation.  Past Medical History    Past Medical History:  Diagnosis Date   Adrenal nodule (HCC) 11/25/2012   Incidental finding when he was worked up for kidney stone with CT Nov '13. He is asymptomatic         IMPRESSION:   16 mm right adrenal nodule is unchanged in size comparing   05/16/2012. Absolute and relative washout characteristics of the   nodule are most suggestive of benign adrenal adenoma. However, the   precontrast attenuation of 41 HU is higher than typically seen for   adenoma and some re   Allergic rhinitis 10/28/2015   claritin    Nasonex  Rx  Netty pot     BPH (benign prostatic hyperplasia)    Chronic kidney disease 04/27/2012   nephrolithiasis 4mm left - passed   Dyslipidemia 06/08/2007      Med - zocor  20 mg daily - well tolerated w/o adverse side effects      ELEVATED PROSTATE SPECIFIC ANTIGEN 07/16/2009   Dr Matilda q 6 mo        Essential hypertension 06/08/2007   Prinizide  BP is good at home     Exposure to water pollution 03/16/2017      [facility] TCE, PCE and others exposure, remote  The pt has CRI     Frontotemporal dementia (HCC) 11/03/2023   H/O urinary retention 06/27/2012   secondary to decongestants - resolved.   Hyperlipidemia    Hypertension    Hypogonadism in male 06/29/2007   On Androgel   Dr Matilda   Potential benefits of a long term testosterone   use as well as potential risks  and complications were explained to the patient and were aknowledged.              Pain in joint, shoulder region 09/01/2014   3/16 R trap MSK pain     Rash and nonspecific skin eruption 10/28/2015   scabbed  papule on L scalp and one on L abd 5/17  ?etiol     Type 2 diabetes, controlled, with renal manifestation (HCC) 06/08/2007   Dr Kassie   Pt is off metformin , sulfonylurea (Amaryl ) 2018  On Basiglar 20 u/d 3/18        Well adult exam 11/22/2011   Colonoscopy - due (May '13)  Immunizations: Tetanus Jan '09  Cologuard 7/15     Past Surgical History:  Procedure Laterality Date   MOUTH SURGERY     MOUTH SURGERY  2012   with removal of a hard palate boney deformith   NASAL SINUS SURGERY      Allergies  Allergies  Allergen Reactions   Sudafed [Pseudoephedrine]      Labs/Other Studies Reviewed    The following studies were reviewed today: ***    Recent Labs: 08/02/2023: TSH 2.17 11/10/2023: ALT 26; BUN 9; Creatinine, Ser 1.31; Hemoglobin 11.3; Magnesium 2.3; Platelets 215; Potassium 3.1; Sodium 137  Recent Lipid Panel    Component Value Date/Time   CHOL 162 03/16/2017 0726   TRIG 216.0 (H) 03/16/2017 0726   TRIG 73 05/24/2006 0833   HDL 54.20 03/16/2017 0726   CHOLHDL 3 03/16/2017 0726  VLDL 43.2 (H) 03/16/2017 0726   LDLCALC 87 09/09/2016 0725   LDLDIRECT 60.0 03/16/2017 0726    History of Present Illness    68 year old male with the above past medical history including syncope, first-degree AV block, hypertension, hyperlipidemia, type 2 diabetes, BPH, and dementia.  He presents today for heart first clinic new patient evaluation. He is followed by the [facility].  He was seen in the ED in 10/2023 in the setting of near syncope.  He has a history of prior syncope, this was previously attributed to dehydration.  CT of the head was negative for acute process.  Orthostatics were negative.  EKG showed sinus rhythm/sinus bradycardia with first-degree AV block.  He was referred to cardiology.    CC: -Initial onset:  -Frequency/Duration:  -Associated symptoms:  -Aggravating/alleviating factors:  -Syncope/near syncope: -Prior cardiac history: -Prior ECG:  -Prior workup: -Prior  treatment:  -Possible medication interactions:  -Caffeine:  -Alcohol:  -Tobacco: -OTC supplements:  -Comorbidities:  -Exercise level:  -Labs:  -Cardiac ROS: no chest pain, no shortness of breath, no PND, no orthopnea, no LE edema. She denies any headaches. -Family history:     Home Medications    Current Outpatient Medications  Medication Sig Dispense Refill   amitriptyline  (ELAVIL ) 25 MG tablet Take 1 tablet (25 mg total) by mouth at bedtime as needed. for sleep 90 tablet 3   Blood Glucose Monitoring Suppl (ONETOUCH VERIO IQ SYSTEM) w/Device KIT Use to check blood sugar two times per day. 1 kit 2   finasteride  (PROSCAR ) 5 MG tablet Take 1 tablet (5 mg total) by mouth daily. 90 tablet 3   glucose blood (ONETOUCH VERIO) test strip Use to check blood sugar two times per day. 100 each 5   halobetasol  (ULTRAVATE ) 0.05 % cream APPLY TOPICALLY 2 (TWO) TIMES DAILY. 30 g 1   Insulin  Glargine (BASAGLAR  KWIKPEN) 100 UNIT/ML SOPN Inject 1.3 mLs (130 Units total) into the skin every morning. And pen needles 1/day 15 pen 11   lisinopril -hydrochlorothiazide  (PRINZIDE ,ZESTORETIC ) 20-25 MG tablet Take 1 tablet by mouth daily. 90 tablet 3   loratadine  (CLARITIN ) 10 MG tablet Take 1 tablet (10 mg total) by mouth daily. 100 tablet 3   meclizine  (ANTIVERT ) 25 MG tablet Take 1 tablet (25 mg total) by mouth 3 (three) times daily as needed for dizziness. 30 tablet 0   MICROLET LANCETS MISC Use to check blood sugar 2 times per day. Dx code: E11.9 150 each 2   mometasone  (NASONEX ) 50 MCG/ACT nasal spray Place 2 sprays into the nose daily. 17 g 12   potassium chloride  (KLOR-CON ) 10 MEQ tablet Take 1 tablet (10 mEq total) by mouth daily for 15 days. 15 tablet 0   simvastatin  (ZOCOR ) 20 MG tablet Take 1 tablet (20 mg total) by mouth at bedtime. 90 tablet 3   Testosterone  (ANDROGEL  PUMP TD) Place onto the skin. 1% gel; apply daily     No current facility-administered medications for this visit.     Review of  Systems    ***.  All other systems reviewed and are otherwise negative except as noted above.    Physical Exam    VS:  There were no vitals taken for this visit. , BMI There is no height or weight on file to calculate BMI.     GEN: Well nourished, well developed, in no acute distress. HEENT: normal. Neck: Supple, no JVD, carotid bruits, or masses. Cardiac: RRR, no murmurs, rubs, or gallops. No clubbing, cyanosis, edema.  Radials/DP/PT  2+ and equal bilaterally.  Respiratory:  Respirations regular and unlabored, clear to auscultation bilaterally. GI: Soft, nontender, nondistended, BS + x 4. MS: no deformity or atrophy. Skin: warm and dry, no rash. Neuro:  Strength and sensation are intact. Psych: Normal affect.  Accessory Clinical Findings    ECG personally reviewed by me today -    - no acute changes.   Lab Results  Component Value Date   WBC 6.1 11/10/2023   HGB 11.3 (L) 11/10/2023   HCT 35.1 (L) 11/10/2023   MCV 90.5 11/10/2023   PLT 215 11/10/2023   Lab Results  Component Value Date   CREATININE 1.31 (H) 11/10/2023   BUN 9 11/10/2023   NA 137 11/10/2023   K 3.1 (L) 11/10/2023   CL 100 11/10/2023   CO2 29 11/10/2023   Lab Results  Component Value Date   ALT 26 11/10/2023   AST 25 11/10/2023   ALKPHOS 60 11/10/2023   BILITOT 0.7 11/10/2023   Lab Results  Component Value Date   CHOL 162 03/16/2017   HDL 54.20 03/16/2017   LDLCALC 87 09/09/2016   LDLDIRECT 60.0 03/16/2017   TRIG 216.0 (H) 03/16/2017   CHOLHDL 3 03/16/2017    Lab Results  Component Value Date   HGBA1C 8.2 05/22/2017    Assessment & Plan    1.  ***  No BP recorded.  {Refresh Note OR Click here to enter BP  :1}***   Damien JAYSON Braver, NP 02/19/2024, 6:25 AM

## 2024-02-21 NOTE — Progress Notes (Deleted)
 Office Visit    Patient Name: Joe Wise Date of Encounter: 02/21/2024  Primary Care Provider:  Milissa Savant, MD Primary Cardiologist:  None  Chief Complaint    68 year old male with a history of syncope, first-degree AV block, hypertension, hyperlipidemia, type 2 diabetes, BPH, and dementia who presents for heart first clinic new patient evaluation.  Past Medical History    Past Medical History:  Diagnosis Date   Adrenal nodule (HCC) 11/25/2012   Incidental finding when he was worked up for kidney stone with CT Nov '13. He is asymptomatic         IMPRESSION:   16 mm right adrenal nodule is unchanged in size comparing   05/16/2012. Absolute and relative washout characteristics of the   nodule are most suggestive of benign adrenal adenoma. However, the   precontrast attenuation of 41 HU is higher than typically seen for   adenoma and some re   Allergic rhinitis 10/28/2015   claritin    Nasonex  Rx  Netty pot     BPH (benign prostatic hyperplasia)    Chronic kidney disease 04/27/2012   nephrolithiasis 4mm left - passed   Dyslipidemia 06/08/2007      Med - zocor  20 mg daily - well tolerated w/o adverse side effects      ELEVATED PROSTATE SPECIFIC ANTIGEN 07/16/2009   Dr Matilda q 6 mo        Essential hypertension 06/08/2007   Prinizide  BP is good at home     Exposure to water pollution 03/16/2017      [facility] TCE, PCE and others exposure, remote  The pt has CRI     Frontotemporal dementia (HCC) 11/03/2023   H/O urinary retention 06/27/2012   secondary to decongestants - resolved.   Hyperlipidemia    Hypertension    Hypogonadism in male 06/29/2007   On Androgel   Dr Matilda   Potential benefits of a long term testosterone   use as well as potential risks  and complications were explained to the patient and were aknowledged.              Pain in joint, shoulder region 09/01/2014   3/16 R trap MSK pain     Rash and nonspecific skin eruption 10/28/2015   scabbed  papule on L scalp and one on L abd 5/17  ?etiol     Type 2 diabetes, controlled, with renal manifestation (HCC) 06/08/2007   Dr Kassie   Pt is off metformin , sulfonylurea (Amaryl ) 2018  On Basiglar 20 u/d 3/18        Well adult exam 11/22/2011   Colonoscopy - due (May '13)  Immunizations: Tetanus Jan '09  Cologuard 7/15     Past Surgical History:  Procedure Laterality Date   MOUTH SURGERY     MOUTH SURGERY  2012   with removal of a hard palate boney deformith   NASAL SINUS SURGERY      Allergies  Allergies  Allergen Reactions   Sudafed [Pseudoephedrine]      Labs/Other Studies Reviewed    The following studies were reviewed today: ***    Recent Labs: 08/02/2023: TSH 2.17 11/10/2023: ALT 26; BUN 9; Creatinine, Ser 1.31; Hemoglobin 11.3; Magnesium 2.3; Platelets 215; Potassium 3.1; Sodium 137  Recent Lipid Panel    Component Value Date/Time   CHOL 162 03/16/2017 0726   TRIG 216.0 (H) 03/16/2017 0726   TRIG 73 05/24/2006 0833   HDL 54.20 03/16/2017 0726   CHOLHDL 3 03/16/2017 0726  VLDL 43.2 (H) 03/16/2017 0726   LDLCALC 87 09/09/2016 0725   LDLDIRECT 60.0 03/16/2017 0726    History of Present Illness    68 year old male with the above past medical history including syncope, first-degree AV block, hypertension, hyperlipidemia, type 2 diabetes, BPH, and dementia.  He presents today for heart first clinic new patient evaluation. He is followed by the [facility].  He was seen in the ED in 10/2023 in the setting of near syncope.  He has a history of prior syncope, this was previously attributed to dehydration.  CT of the head was negative for acute process.  Orthostatics were negative.  EKG showed sinus rhythm/sinus bradycardia with first-degree AV block.  He was referred to cardiology.    CC: -Initial onset:  -Frequency/Duration:  -Associated symptoms:  -Aggravating/alleviating factors:  -Syncope/near syncope: -Prior cardiac history: -Prior ECG:  -Prior workup: -Prior  treatment:  -Possible medication interactions:  -Caffeine:  -Alcohol:  -Tobacco: -OTC supplements:  -Comorbidities:  -Exercise level:  -Labs:  -Cardiac ROS: no chest pain, no shortness of breath, no PND, no orthopnea, no LE edema. She denies any headaches. -Family history:     Home Medications    Current Outpatient Medications  Medication Sig Dispense Refill   amitriptyline  (ELAVIL ) 25 MG tablet Take 1 tablet (25 mg total) by mouth at bedtime as needed. for sleep 90 tablet 3   Blood Glucose Monitoring Suppl (ONETOUCH VERIO IQ SYSTEM) w/Device KIT Use to check blood sugar two times per day. 1 kit 2   finasteride  (PROSCAR ) 5 MG tablet Take 1 tablet (5 mg total) by mouth daily. 90 tablet 3   glucose blood (ONETOUCH VERIO) test strip Use to check blood sugar two times per day. 100 each 5   halobetasol  (ULTRAVATE ) 0.05 % cream APPLY TOPICALLY 2 (TWO) TIMES DAILY. 30 g 1   Insulin  Glargine (BASAGLAR  KWIKPEN) 100 UNIT/ML SOPN Inject 1.3 mLs (130 Units total) into the skin every morning. And pen needles 1/day 15 pen 11   lisinopril -hydrochlorothiazide  (PRINZIDE ,ZESTORETIC ) 20-25 MG tablet Take 1 tablet by mouth daily. 90 tablet 3   loratadine  (CLARITIN ) 10 MG tablet Take 1 tablet (10 mg total) by mouth daily. 100 tablet 3   meclizine  (ANTIVERT ) 25 MG tablet Take 1 tablet (25 mg total) by mouth 3 (three) times daily as needed for dizziness. 30 tablet 0   MICROLET LANCETS MISC Use to check blood sugar 2 times per day. Dx code: E11.9 150 each 2   mometasone  (NASONEX ) 50 MCG/ACT nasal spray Place 2 sprays into the nose daily. 17 g 12   potassium chloride  (KLOR-CON ) 10 MEQ tablet Take 1 tablet (10 mEq total) by mouth daily for 15 days. 15 tablet 0   simvastatin  (ZOCOR ) 20 MG tablet Take 1 tablet (20 mg total) by mouth at bedtime. 90 tablet 3   Testosterone  (ANDROGEL  PUMP TD) Place onto the skin. 1% gel; apply daily     No current facility-administered medications for this visit.     Review of  Systems    ***.  All other systems reviewed and are otherwise negative except as noted above.    Physical Exam    VS:  There were no vitals taken for this visit. , BMI There is no height or weight on file to calculate BMI.     GEN: Well nourished, well developed, in no acute distress. HEENT: normal. Neck: Supple, no JVD, carotid bruits, or masses. Cardiac: RRR, no murmurs, rubs, or gallops. No clubbing, cyanosis, edema.  Radials/DP/PT  2+ and equal bilaterally.  Respiratory:  Respirations regular and unlabored, clear to auscultation bilaterally. GI: Soft, nontender, nondistended, BS + x 4. MS: no deformity or atrophy. Skin: warm and dry, no rash. Neuro:  Strength and sensation are intact. Psych: Normal affect.  Accessory Clinical Findings    ECG personally reviewed by me today -    - no acute changes.   Lab Results  Component Value Date   WBC 6.1 11/10/2023   HGB 11.3 (L) 11/10/2023   HCT 35.1 (L) 11/10/2023   MCV 90.5 11/10/2023   PLT 215 11/10/2023   Lab Results  Component Value Date   CREATININE 1.31 (H) 11/10/2023   BUN 9 11/10/2023   NA 137 11/10/2023   K 3.1 (L) 11/10/2023   CL 100 11/10/2023   CO2 29 11/10/2023   Lab Results  Component Value Date   ALT 26 11/10/2023   AST 25 11/10/2023   ALKPHOS 60 11/10/2023   BILITOT 0.7 11/10/2023   Lab Results  Component Value Date   CHOL 162 03/16/2017   HDL 54.20 03/16/2017   LDLCALC 87 09/09/2016   LDLDIRECT 60.0 03/16/2017   TRIG 216.0 (H) 03/16/2017   CHOLHDL 3 03/16/2017    Lab Results  Component Value Date   HGBA1C 8.2 05/22/2017    Assessment & Plan    1.  ***  No BP recorded.  {Refresh Note OR Click here to enter BP  :1}***        Signed, Jon Nat Hails, [place]  02/21/2024 3:30 PM    [facility]

## 2024-02-22 ENCOUNTER — Ambulatory Visit: Admitting: Physician Assistant

## 2024-05-06 NOTE — Progress Notes (Incomplete)
 Assessment/Plan:    Frontotemporal dementia  Joe Wise is a very pleasant 68 y.o. RH male with a history ofhypertension, hyperlipidemia, DM2, adrenal nodule, seen today for evaluation of memory loss. He is not on antidementia medication as this is not indicated for patients with FTD.  Patient is able to participate on ADLs***and to drive without significant difficulties.  Mood is***    Follow up in   months. No indication for antidementia medication given the current diagnosis Recommend good control of her cardiovascular risk factors, follow with Cards Continue to control mood as per PCP     Subjective:    This patient is accompanied in the office by his son and daughter*** who supplements the history.  Previous records as well as any outside records available were reviewed prior to todays visit. Patient was last seen on  11/03/23, unable to perform MoCA or MMSE as he is unable to understand commands ***   Any changes in memory since last visit?  repeats oneself?  Endorsed Disoriented when walking into a room? Denies ***  Leaving objects?  May misplace things but not in unusual places***  Wandering behavior?  denies   Any personality changes since last visit?  Any worsening depression?:  Denies.   Hallucinations or paranoia?  Denies.   Seizures? denies    Any sleep changes?  Denies vivid dreams, REM behavior or sleepwalking   Sleep apnea?   Denies.   Any hygiene concerns? Denies.  Independent of bathing and dressing?  Endorsed  Does the patient needs help with medications?   is in charge *** Who is in charge of the finances?   is in charge   *** Any changes in appetite?  denies ***   Patient have trouble swallowing? Denies.   Does the patient cook? No Any headaches?   denies   Any vision changes?*** Chronic back pain  denies   Ambulates with difficulty? Denies.  *** Recent falls or head injuries? Had an  episode of LOC 10/2023 attributed to dehydration, BP meds,  elevated BS levels, bradycardia (270)falling and hitting his head     Unilateral weakness, numbness or tingling? denies   Any tremors?  Denies  *** Any anosmia?  Denies   Any incontinence of urine?  Endorsed***  Any bowel dysfunction?   Denies      Patient lives   *** Does the patient drive? No longer drives ***   MRI of the brain personally reviewed  remarkable for frontotemporal atrophy, advanced and asymmetrical, severe on the left and is severe in the left hemisphere   Initial visit 08/02/23 How long did patient have memory difficulties? His son reports  Since April 2024, when he forgot to eat I became worried, but he may have shown signs before that.  Since that time, he began to forget more often new information, recent conversations, names. He even forgets his own family members' names, but can recognize faces.  repeats oneself?  Endorsed, frequently, and also repeats same phrases.  Disoriented when walking into a room?  Patient denies    Leaving objects in unusual places? Endorsed, for example toothpaste in the freezer.    Wandering behavior? He drove to [place], run the car out of gas and got off, started walking, and EMS was able to find his phone, but  he was unable to tell where he was.   Any personality changes, or depression, anxiety? He is ok . Son denies any major personality changes Hallucinations or paranoia?  Denies.   Seizures? Denies.    Any sleep changes?  Does not sleep well.  He watches [employer] most of the night.  He reports vivid dreams, no REM behavior or sleepwalking   Sleep apnea? After adenoid removal he does not have sleep apnea Any hygiene concerns?  Denies.   Independent of bathing and dressing? Endorsed  Does the patient need help with medications? Son  is in charge   Who is in charge of the finances? Daughter  is in charge     Any changes in appetite?  Improved some, but sometimes he needs to be reminded. Not drinking enough water.     Patient have  trouble swallowing?  Denies.   Does the patient cook? No  Any headaches?  Denies.   Chronic pain? Denies.   Ambulates with difficulty? Denies  Recent falls or head injuries? 08/01/23, he had a mechanical fall, no LOC, no head trauma Vision changes?  Denies any new issues.    Any strokelike symptoms? Denies.   Any tremors? Denies.   Any anosmia? Denies.   Any incontinence of urine? Denies.   Any bowel dysfunction? Denies.      Patient lives with his daughter    History of heavy alcohol intake? 3 beers a day, on occasion more. He likes Bourbon and Vodka  History of heavy tobacco use? Endorsed, 2-3 cig a day.   Family history of dementia?   His mother had AD   Does patient drive? 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 for sleep   Blood Glucose Monitoring Suppl (ONETOUCH VERIO IQ SYSTEM) w/Device KIT Use to check blood sugar two times per day.   finasteride  (PROSCAR ) 5 MG tablet Take 1 tablet (5 mg total) by mouth daily.   glucose blood (ONETOUCH VERIO) test strip Use to check blood sugar two times per day.   halobetasol  (ULTRAVATE ) 0.05 % cream APPLY TOPICALLY 2 (TWO) TIMES DAILY.   Insulin  Glargine (BASAGLAR  KWIKPEN) 100 UNIT/ML SOPN Inject 1.3 mLs (130 Units total) into the skin every morning. And pen needles 1/day   lisinopril -hydrochlorothiazide  (PRINZIDE ,ZESTORETIC ) 20-25 MG tablet Take 1 tablet by mouth daily.   loratadine  (CLARITIN ) 10 MG tablet Take 1 tablet (10 mg total) by mouth daily.   meclizine  (ANTIVERT ) 25 MG tablet Take 1 tablet (25 mg total) by mouth 3 (three) times daily as needed for dizziness.   MICROLET LANCETS MISC Use to check blood sugar 2 times per day.  Dx code: E11.9   mometasone  (NASONEX ) 50 MCG/ACT nasal spray Place 2 sprays into the nose daily.   potassium chloride  (KLOR-CON ) 10 MEQ tablet Take 1 tablet (10 mEq total) by mouth daily for 15 days.   simvastatin  (ZOCOR ) 20 MG tablet Take 1 tablet (20 mg total) by mouth at bedtime.   Testosterone  (ANDROGEL  PUMP TD) Place onto the skin. 1% gel; apply daily   No facility-administered encounter medications on file as of 05/08/2024.       08/02/2023    8:00 AM  MMSE - Mini Mental State Exam  Orientation to time 0  Orientation to Place 0  Registration 0  Attention/ Calculation 0  Recall 0  Language- name  2 objects 0  Language- repeat 0  Language- follow 3 step command 0  Language- read & follow direction 0  Write a sentence 0  Write a sentence-comments did not understand command  Copy design 0  Copy design-comments did not comprehend command  Total score 0       No data to display          Objective:     PHYSICAL EXAMINATION:    VITALS:  There were no vitals filed for this visit.  GEN:  The patient appears stated age and is in NAD. HEENT:  Normocephalic, atraumatic.   Neurological examination:  General: NAD, well-groomed, appears stated age. Orientation: The patient is alert. Oriented to person, place and date Cranial nerves: There is good facial symmetry.The speech is fluent and clear. No aphasia or dysarthria. Fund of knowledge is appropriate. Recent and remote memory are impaired. Attention and concentration are reduced. Able to name objects and repeat phrases.  Hearing is intact to conversational tone. *** Sensation: Sensation is intact to light touch throughout Motor: Strength is at least antigravity x4. DTR's 2/4 in UE/LE     Movement examination: Tone: There is normal tone in the UE/LE Abnormal movements:  no tremor.  No myoclonus.  No asterixis.   Coordination:  There is no decremation with RAM's. Normal finger to nose  Gait and Station: The patient has  no*** difficulty arising out of a deep-seated chair without the use of the hands. The patient's stride length is good.  Gait is cautious and narrow.    Thank you for allowing us  the opportunity to participate in the care of this nice patient. Please do not hesitate to contact us  for any questions or concerns.   Total time spent on today's visit was *** minutes dedicated to this patient today, preparing to see patient, examining the patient, ordering tests and/or medications and counseling the patient, documenting clinical information in the EHR or other health record, independently interpreting results and communicating results to the patient/family, discussing treatment and goals, answering patient's questions and coordinating care.  Cc:  Milissa Savant, MD  Camie Sevin 05/06/2024 10:43 AM

## 2024-05-08 ENCOUNTER — Ambulatory Visit: Admitting: Physician Assistant

## 2024-05-08 ENCOUNTER — Encounter: Payer: Self-pay | Admitting: Physician Assistant

## 2024-07-09 NOTE — Progress Notes (Incomplete)
 "   Dementia likely due to    Joe Wise is a very pleasant 69 y.o. RH male with a history of hypertension, hyperlipidemia, DM2, adrenal nodule and a diagnosis of frontotemporal dementia seen today in follow up for memory loss.  As recall, MoCA was unable to be performed during his last visit, and his MMSE was 0.  The CT of the head personally reviewed was remarkable for cerebral and hippocampal atrophy.  MRI of the brain confirmed frontotemporal atrophy, advanced and asymmetrical, severe on the left hemisphere.  LP was negative for AD.  Etiology of the neurodegenerative disease is likely of the temporal dementia, advanced.  Per latest literature, there is no indication for antidementia medication for the treatment of FTD as it may exacerbate the symptoms.  At recommend that if any behavioral changes should occur, moods need to be treated with medication such as SSRIs (Zoloft, Lexapro, Celexa, etc.), antipsychotics such as quetiapine or olanzapine for behavioral symptoms.  If this should occur, recommend psychiatry evaluation for medication management.  This patient is accompanied in the office by his son and his daughter who supplemented history.  He needs assistance with his ADLs.  Previous records as well as any outside records available were reviewed prior to todays visit.  No follow-up is indicated Recommend psychiatry evaluation for management of this related to FTD no indication for antidementia medication given the diagnosis of FTD  Recommend social work involvement.  Goals of care Recommend good control of cardiovascular risk factors.   Continue to control mood as per PCP   Discussed the use of AI scribe software for clinical note transcription with the patient, who gave verbal consent to proceed.  History of Present Illness     Initial visit 08/02/23 How long did patient have memory difficulties? His son reports  Since April 2024, when he forgot to eat I became worried, but he may  have shown signs before that.  Since that time, he began to forget more often new information, recent conversations, names. He even forgets his own family members' names, but can recognize faces.  repeats oneself?  Endorsed, frequently, and also repeats same phrases.  Disoriented when walking into a room?  Patient denies    Leaving objects in unusual places? Endorsed, for example toothpaste in the freezer.    Wandering behavior? He drove to [place], run the car out of gas and got off, started walking, and EMS was able to find his phone, but  he was unable to tell where he was.   Any personality changes, or depression, anxiety? He is ok . Son denies any major personality changes Hallucinations or paranoia? Denies.   Seizures? Denies.    Any sleep changes?  Does not sleep well.  He watches [employer] most of the night.  He reports vivid dreams, no REM behavior or sleepwalking   Sleep apnea? After adenoid removal he does not have sleep apnea Any hygiene concerns?  Denies.   Independent of bathing and dressing? Endorsed  Does the patient need help with medications? Son  is in charge   Who is in charge of the finances? Daughter  is in charge     Any changes in appetite?  Improved some, but sometimes he needs to be reminded. Not drinking enough water.     Patient have trouble swallowing?  Denies.   Does the patient cook? No  Any headaches?  Denies.   Chronic pain? Denies.   Ambulates with difficulty? Denies  Recent falls  or head injuries? 08/01/23, he had a mechanical fall, no LOC, no head trauma Vision changes?  Denies any new issues.    Any strokelike symptoms? Denies.   Any tremors? Denies.   Any anosmia? Denies.   Any incontinence of urine? Denies.   Any bowel dysfunction? Denies.      Patient lives with his daughter    History of heavy alcohol intake? 3 beers a day, on occasion more. He likes Bourbon and Vodka  History of heavy tobacco use? Endorsed, 2-3 cig a day.   Family history of  dementia?   His mother had AD   Does patient drive? 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  GEN:  The patient appears stated age and is in NAD. HEENT:  Normocephalic, atraumatic.   Neurological examination:  General: NAD, well-groomed, appears stated age. Orientation: The patient is alert. Oriented to person, place and not to date Cranial nerves: There is good facial symmetry.The speech is fluent and clear. No aphasia or dysarthria. Fund of knowledge is appropriate. Recent and remote memory are impaired. Attention and concentration are reduced. Able to name objects and repeat phrases.  Hearing is intact to conversational tone. *** Sensation: Sensation is intact to light touch throughout Motor: Strength is at least antigravity x4. DTR's 2/4 in UE/LE     Movement examination:  Tone: There is normal tone in the UE/LE Abnormal movements:  no tremor.  No myoclonus.  No asterixis.   Coordination:  There is no decremation with RAM's. Normal finger to nose  Gait and Station: The patient has no*** difficulty arising out of a deep-seated chair without the use of the hands. The patient's stride length is good.  Gait is cautious and narrow.    Thank you for allowing us  the opportunity to participate in the care of this  nice patient. Please do not hesitate to contact us  for any questions or concerns.   Total time spent on today's visit was *** minutes dedicated to this patient today, preparing to see patient, examining the patient, ordering tests and/or medications and counseling the patient, documenting clinical information in the EHR or other health record, independently interpreting results and communicating results to the patient/family, discussing treatment and goals, answering patient's questions and coordinating care.  Cc:  Milissa Savant, MD  Camie Sevin 07/09/2024 6:22 AM      "

## 2024-07-10 ENCOUNTER — Ambulatory Visit: Admitting: Physician Assistant
